# Patient Record
Sex: Female | Born: 1935
Health system: Southern US, Community
[De-identification: ages and names within clinical notes are randomized; demographics above are authoritative.]

## PROBLEM LIST (undated history)

## (undated) DIAGNOSIS — R32 Unspecified urinary incontinence: Secondary | ICD-10-CM

## (undated) DIAGNOSIS — B019 Varicella without complication: Secondary | ICD-10-CM

## (undated) DIAGNOSIS — C801 Malignant (primary) neoplasm, unspecified: Secondary | ICD-10-CM

## (undated) DIAGNOSIS — M1990 Unspecified osteoarthritis, unspecified site: Secondary | ICD-10-CM

## (undated) DIAGNOSIS — N63 Unspecified lump in unspecified breast: Secondary | ICD-10-CM

## (undated) DIAGNOSIS — C50919 Malignant neoplasm of unspecified site of unspecified female breast: Secondary | ICD-10-CM

## (undated) DIAGNOSIS — K219 Gastro-esophageal reflux disease without esophagitis: Secondary | ICD-10-CM

## (undated) DIAGNOSIS — Z923 Personal history of irradiation: Secondary | ICD-10-CM

## (undated) HISTORY — PX: EYE SURGERY: SHX253

## (undated) HISTORY — DX: Unspecified osteoarthritis, unspecified site: M19.90

## (undated) HISTORY — DX: Unspecified urinary incontinence: R32

## (undated) HISTORY — DX: Varicella without complication: B01.9

## (undated) HISTORY — PX: COLONOSCOPY: SHX174

## (undated) HISTORY — PX: TONSILLECTOMY: SUR1361

## (undated) HISTORY — DX: Malignant (primary) neoplasm, unspecified: C80.1

## (undated) HISTORY — DX: Gastro-esophageal reflux disease without esophagitis: K21.9

---
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
## 1966-02-27 HISTORY — PX: ABDOMINAL HYSTERECTOMY: SHX81

## 1971-02-28 HISTORY — PX: AUGMENTATION MAMMAPLASTY: SUR837

## 1997-09-29 ENCOUNTER — Ambulatory Visit (HOSPITAL_COMMUNITY): Admission: RE | Admit: 1997-09-29 | Discharge: 1997-09-29 | Payer: Self-pay | Admitting: Ophthalmology

## 1998-12-29 ENCOUNTER — Encounter: Admission: RE | Admit: 1998-12-29 | Discharge: 1998-12-29 | Payer: Self-pay | Admitting: Geriatric Medicine

## 1998-12-29 ENCOUNTER — Encounter: Payer: Self-pay | Admitting: Geriatric Medicine

## 2001-03-13 ENCOUNTER — Encounter: Payer: Self-pay | Admitting: Geriatric Medicine

## 2001-03-13 ENCOUNTER — Encounter: Admission: RE | Admit: 2001-03-13 | Discharge: 2001-03-13 | Payer: Self-pay | Admitting: Geriatric Medicine

## 2003-02-28 HISTORY — PX: BREAST LUMPECTOMY: SHX2

## 2004-06-23 LAB — HM DEXA SCAN

## 2008-02-28 DIAGNOSIS — C50919 Malignant neoplasm of unspecified site of unspecified female breast: Secondary | ICD-10-CM

## 2008-02-28 DIAGNOSIS — C801 Malignant (primary) neoplasm, unspecified: Secondary | ICD-10-CM

## 2008-02-28 HISTORY — PX: BREAST SURGERY: SHX581

## 2008-02-28 HISTORY — PX: BREAST LUMPECTOMY: SHX2

## 2008-02-28 HISTORY — DX: Malignant neoplasm of unspecified site of unspecified female breast: C50.919

## 2008-02-28 HISTORY — DX: Malignant (primary) neoplasm, unspecified: C80.1

## 2011-03-16 DIAGNOSIS — Z961 Presence of intraocular lens: Secondary | ICD-10-CM | POA: Diagnosis not present

## 2011-03-28 DIAGNOSIS — D0590 Unspecified type of carcinoma in situ of unspecified breast: Secondary | ICD-10-CM | POA: Diagnosis not present

## 2011-03-29 DIAGNOSIS — D0590 Unspecified type of carcinoma in situ of unspecified breast: Secondary | ICD-10-CM | POA: Diagnosis not present

## 2011-03-29 DIAGNOSIS — N6489 Other specified disorders of breast: Secondary | ICD-10-CM | POA: Diagnosis not present

## 2011-04-04 DIAGNOSIS — D0590 Unspecified type of carcinoma in situ of unspecified breast: Secondary | ICD-10-CM | POA: Diagnosis not present

## 2011-04-10 DIAGNOSIS — K219 Gastro-esophageal reflux disease without esophagitis: Secondary | ICD-10-CM | POA: Diagnosis not present

## 2011-04-10 DIAGNOSIS — Z803 Family history of malignant neoplasm of breast: Secondary | ICD-10-CM | POA: Diagnosis not present

## 2011-04-10 DIAGNOSIS — N6039 Fibrosclerosis of unspecified breast: Secondary | ICD-10-CM | POA: Diagnosis not present

## 2011-04-10 DIAGNOSIS — Z853 Personal history of malignant neoplasm of breast: Secondary | ICD-10-CM | POA: Diagnosis not present

## 2011-04-10 DIAGNOSIS — N61 Mastitis without abscess: Secondary | ICD-10-CM | POA: Diagnosis not present

## 2011-04-10 DIAGNOSIS — N63 Unspecified lump in unspecified breast: Secondary | ICD-10-CM | POA: Diagnosis not present

## 2011-05-11 DIAGNOSIS — Z1231 Encounter for screening mammogram for malignant neoplasm of breast: Secondary | ICD-10-CM | POA: Diagnosis not present

## 2011-05-17 DIAGNOSIS — J209 Acute bronchitis, unspecified: Secondary | ICD-10-CM | POA: Diagnosis not present

## 2011-06-06 DIAGNOSIS — N393 Stress incontinence (female) (male): Secondary | ICD-10-CM | POA: Diagnosis not present

## 2011-06-06 DIAGNOSIS — Z853 Personal history of malignant neoplasm of breast: Secondary | ICD-10-CM | POA: Diagnosis not present

## 2011-06-06 DIAGNOSIS — E785 Hyperlipidemia, unspecified: Secondary | ICD-10-CM | POA: Diagnosis not present

## 2011-06-06 DIAGNOSIS — Z0000 Encounter for general adult medical examination without abnormal findings: Secondary | ICD-10-CM | POA: Diagnosis not present

## 2011-06-06 DIAGNOSIS — K219 Gastro-esophageal reflux disease without esophagitis: Secondary | ICD-10-CM | POA: Diagnosis not present

## 2011-06-13 DIAGNOSIS — E785 Hyperlipidemia, unspecified: Secondary | ICD-10-CM | POA: Diagnosis not present

## 2011-06-13 DIAGNOSIS — Z79899 Other long term (current) drug therapy: Secondary | ICD-10-CM | POA: Diagnosis not present

## 2011-06-13 DIAGNOSIS — R5381 Other malaise: Secondary | ICD-10-CM | POA: Diagnosis not present

## 2011-06-22 DIAGNOSIS — Z961 Presence of intraocular lens: Secondary | ICD-10-CM | POA: Diagnosis not present

## 2011-06-22 DIAGNOSIS — H43399 Other vitreous opacities, unspecified eye: Secondary | ICD-10-CM | POA: Diagnosis not present

## 2011-06-28 DIAGNOSIS — G4700 Insomnia, unspecified: Secondary | ICD-10-CM | POA: Diagnosis not present

## 2011-06-28 DIAGNOSIS — K219 Gastro-esophageal reflux disease without esophagitis: Secondary | ICD-10-CM | POA: Diagnosis not present

## 2011-06-28 DIAGNOSIS — E785 Hyperlipidemia, unspecified: Secondary | ICD-10-CM | POA: Diagnosis not present

## 2011-06-28 DIAGNOSIS — N393 Stress incontinence (female) (male): Secondary | ICD-10-CM | POA: Diagnosis not present

## 2011-08-08 DIAGNOSIS — N63 Unspecified lump in unspecified breast: Secondary | ICD-10-CM | POA: Diagnosis not present

## 2011-08-15 DIAGNOSIS — N61 Mastitis without abscess: Secondary | ICD-10-CM | POA: Diagnosis not present

## 2011-09-12 DIAGNOSIS — D0590 Unspecified type of carcinoma in situ of unspecified breast: Secondary | ICD-10-CM | POA: Diagnosis not present

## 2011-12-03 DIAGNOSIS — Z23 Encounter for immunization: Secondary | ICD-10-CM | POA: Diagnosis not present

## 2011-12-04 DIAGNOSIS — J209 Acute bronchitis, unspecified: Secondary | ICD-10-CM | POA: Diagnosis not present

## 2012-01-05 DIAGNOSIS — N3946 Mixed incontinence: Secondary | ICD-10-CM | POA: Diagnosis not present

## 2012-01-05 DIAGNOSIS — K219 Gastro-esophageal reflux disease without esophagitis: Secondary | ICD-10-CM | POA: Diagnosis not present

## 2012-01-05 DIAGNOSIS — Z853 Personal history of malignant neoplasm of breast: Secondary | ICD-10-CM | POA: Diagnosis not present

## 2012-02-13 DIAGNOSIS — N61 Mastitis without abscess: Secondary | ICD-10-CM | POA: Diagnosis not present

## 2012-03-05 DIAGNOSIS — D0590 Unspecified type of carcinoma in situ of unspecified breast: Secondary | ICD-10-CM | POA: Diagnosis not present

## 2012-03-05 DIAGNOSIS — N6489 Other specified disorders of breast: Secondary | ICD-10-CM | POA: Diagnosis not present

## 2012-03-05 DIAGNOSIS — Z79899 Other long term (current) drug therapy: Secondary | ICD-10-CM | POA: Diagnosis not present

## 2012-03-05 DIAGNOSIS — Z1231 Encounter for screening mammogram for malignant neoplasm of breast: Secondary | ICD-10-CM | POA: Diagnosis not present

## 2012-03-07 DIAGNOSIS — D0590 Unspecified type of carcinoma in situ of unspecified breast: Secondary | ICD-10-CM | POA: Diagnosis not present

## 2012-03-07 DIAGNOSIS — Z1231 Encounter for screening mammogram for malignant neoplasm of breast: Secondary | ICD-10-CM | POA: Diagnosis not present

## 2012-03-07 DIAGNOSIS — Z79899 Other long term (current) drug therapy: Secondary | ICD-10-CM | POA: Diagnosis not present

## 2012-03-07 DIAGNOSIS — R928 Other abnormal and inconclusive findings on diagnostic imaging of breast: Secondary | ICD-10-CM | POA: Diagnosis not present

## 2012-03-07 DIAGNOSIS — N6489 Other specified disorders of breast: Secondary | ICD-10-CM | POA: Diagnosis not present

## 2012-03-13 DIAGNOSIS — N63 Unspecified lump in unspecified breast: Secondary | ICD-10-CM | POA: Diagnosis not present

## 2012-03-30 LAB — HM MAMMOGRAPHY: HM Mammogram: NORMAL

## 2012-04-24 DIAGNOSIS — Z79899 Other long term (current) drug therapy: Secondary | ICD-10-CM | POA: Diagnosis not present

## 2012-04-24 DIAGNOSIS — N6489 Other specified disorders of breast: Secondary | ICD-10-CM | POA: Diagnosis not present

## 2012-04-24 DIAGNOSIS — Z1231 Encounter for screening mammogram for malignant neoplasm of breast: Secondary | ICD-10-CM | POA: Diagnosis not present

## 2012-04-24 DIAGNOSIS — D0590 Unspecified type of carcinoma in situ of unspecified breast: Secondary | ICD-10-CM | POA: Diagnosis not present

## 2012-05-23 DIAGNOSIS — E785 Hyperlipidemia, unspecified: Secondary | ICD-10-CM | POA: Diagnosis not present

## 2012-05-23 DIAGNOSIS — Z79899 Other long term (current) drug therapy: Secondary | ICD-10-CM | POA: Diagnosis not present

## 2012-05-23 DIAGNOSIS — Z0000 Encounter for general adult medical examination without abnormal findings: Secondary | ICD-10-CM | POA: Diagnosis not present

## 2012-06-06 DIAGNOSIS — Z0000 Encounter for general adult medical examination without abnormal findings: Secondary | ICD-10-CM | POA: Diagnosis not present

## 2012-06-06 DIAGNOSIS — Z853 Personal history of malignant neoplasm of breast: Secondary | ICD-10-CM | POA: Diagnosis not present

## 2012-06-06 DIAGNOSIS — K219 Gastro-esophageal reflux disease without esophagitis: Secondary | ICD-10-CM | POA: Diagnosis not present

## 2012-06-06 DIAGNOSIS — M1710 Unilateral primary osteoarthritis, unspecified knee: Secondary | ICD-10-CM | POA: Diagnosis not present

## 2012-06-06 DIAGNOSIS — N3941 Urge incontinence: Secondary | ICD-10-CM | POA: Diagnosis not present

## 2012-08-13 DIAGNOSIS — N63 Unspecified lump in unspecified breast: Secondary | ICD-10-CM | POA: Diagnosis not present

## 2012-11-27 ENCOUNTER — Ambulatory Visit (INDEPENDENT_AMBULATORY_CARE_PROVIDER_SITE_OTHER): Payer: Medicare Other | Admitting: Internal Medicine

## 2012-11-27 ENCOUNTER — Encounter: Payer: Self-pay | Admitting: Internal Medicine

## 2012-11-27 VITALS — BP 138/74 | HR 97 | Temp 97.8°F | Resp 12 | Ht 62.75 in | Wt 191.8 lb

## 2012-11-27 DIAGNOSIS — M25559 Pain in unspecified hip: Secondary | ICD-10-CM | POA: Diagnosis not present

## 2012-11-27 DIAGNOSIS — K219 Gastro-esophageal reflux disease without esophagitis: Secondary | ICD-10-CM | POA: Diagnosis not present

## 2012-11-27 DIAGNOSIS — Z79899 Other long term (current) drug therapy: Secondary | ICD-10-CM

## 2012-11-27 DIAGNOSIS — M25562 Pain in left knee: Secondary | ICD-10-CM | POA: Insufficient documentation

## 2012-11-27 DIAGNOSIS — G8929 Other chronic pain: Secondary | ICD-10-CM

## 2012-11-27 DIAGNOSIS — R5381 Other malaise: Secondary | ICD-10-CM

## 2012-11-27 DIAGNOSIS — M25569 Pain in unspecified knee: Secondary | ICD-10-CM

## 2012-11-27 DIAGNOSIS — Z803 Family history of malignant neoplasm of breast: Secondary | ICD-10-CM | POA: Diagnosis not present

## 2012-11-27 DIAGNOSIS — Z1211 Encounter for screening for malignant neoplasm of colon: Secondary | ICD-10-CM

## 2012-11-27 DIAGNOSIS — Z23 Encounter for immunization: Secondary | ICD-10-CM | POA: Diagnosis not present

## 2012-11-27 DIAGNOSIS — D126 Benign neoplasm of colon, unspecified: Secondary | ICD-10-CM | POA: Insufficient documentation

## 2012-11-27 DIAGNOSIS — E785 Hyperlipidemia, unspecified: Secondary | ICD-10-CM

## 2012-11-27 DIAGNOSIS — Z853 Personal history of malignant neoplasm of breast: Secondary | ICD-10-CM | POA: Insufficient documentation

## 2012-11-27 DIAGNOSIS — IMO0002 Reserved for concepts with insufficient information to code with codable children: Secondary | ICD-10-CM | POA: Insufficient documentation

## 2012-11-27 DIAGNOSIS — M533 Sacrococcygeal disorders, not elsewhere classified: Secondary | ICD-10-CM

## 2012-11-27 DIAGNOSIS — E559 Vitamin D deficiency, unspecified: Secondary | ICD-10-CM

## 2012-11-27 MED ORDER — TETANUS-DIPHTH-ACELL PERTUSSIS 5-2.5-18.5 LF-MCG/0.5 IM SUSP: 0.5000 mL | Freq: Once | INTRAMUSCULAR | Status: DC

## 2012-11-27 NOTE — Assessment & Plan Note (Signed)
She has crepitus on exam, no effusion. Plain film ordered to evaluated for DJD

## 2012-11-27 NOTE — Assessment & Plan Note (Addendum)
She needs referral to oncology for follow up.  Referring to  [facility] Dr Orlie Dakin

## 2012-11-27 NOTE — Progress Notes (Signed)
Patient ID: Brianna Perry, female   DOB: 14-Dec-1932, 79 y.o.   MRN: [id number]   Patient Active Problem List   Diagnosis Date Noted  . Family history of breast cancer in female 11/27/2012  . Screening for colon cancer 11/27/2012  . GERD (gastroesophageal reflux disease) 11/27/2012  . Chronic knee pain 11/27/2012  . Sacroiliac joint disease 11/27/2012    Subjective:  CC:   Chief Complaint  Patient presents with  . Establish Care    HPI:   Brianna Perry is a 79 y.o. female who presents as a new patient to establish primary care with the chief complaint of  Left knee and left posterior hip/sacro iliac pain, for the past 6 to 8 months   No prior trauma.   No previous steroid use. Aggravated by several weeks of packing up her house prior to a recent move. also aggravated by prolonged sitting, and long walks which aggravate knee first.  She can no longer sleep on either side because it aggravated her knee pain.  She has tried placing a pillow between the knees which does not help.    Use of aleve helps somewhat during the day but not at night .  Has a tempur pedic mattress.  Limping.  Prior trials of prescription NSAIDs,  Feels like Aleve helps more.  The knee hurst with walking and kneeling   Worked in a greenhouse for 20 years,  Attributes her joint pain to years of physical labor.     .     Past Medical History  Diagnosis Date  . Arthritis   . Cancer 2010    Right breast  . Chickenpox   . GERD (gastroesophageal reflux disease)   . Urine incontinence     Past Surgical History  Procedure Laterality Date  . Breast surgery Right 2010  . Abdominal hysterectomy  1968  . Tonsillectomy  1941    Family History  Problem Relation Age of Onset  . Arthritis Mother   . Heart disease Mother   . Hypertension Mother   . Heart disease Father     History   Social History  . Marital Status: Married    Spouse Name: Brianna Perry    Number of Children: Brianna Perry  . Years of Education: Brianna Perry    Occupational History  . Not on file.   Social History Main Topics  . Smoking status: Never Smoker   . Smokeless tobacco: Never Used  . Alcohol Use: No  . Drug Use: No  . Sexual Activity: Yes   Other Topics Concern  . Not on file   Social History Narrative  . No narrative on file       @ALLHX @    Review of Systems:   The remainder of the review of systems was negative except those addressed in the HPI.       Objective:  BP 138/74  Pulse 97  Temp(Src) 97.8 F (36.6 C) (Oral)  Resp 12  Ht 5' 2.75" (1.594 m)  Wt 191 lb 12 oz (86.977 kg)  BMI 34.23 kg/m2  SpO2 97%  General appearance: alert, cooperative and appears stated age Ears: normal TM's and external ear canals both ears Throat: lips, mucosa, and tongue normal; teeth and gums normal Neck: no adenopathy, no carotid bruit, supple, symmetrical, trachea midline and thyroid not enlarged, symmetric, no tenderness/mass/nodules Back: symmetric, no curvature. ROM normal. No CVA tenderness. Lungs: clear to auscultation bilaterally Heart: regular rate and rhythm, S1, S2 normal, no murmur, click,  rub or gallop Abdomen: soft, non-tender; bowel sounds normal; no masses,  no organomegaly Pulses: 2+ and symmetric Skin: Skin color, texture, turgor normal. No rashes or lesions Lymph nodes: Cervical, supraclavicular, and axillary nodes normal. MSK: negative straight leg lift  Assessment and Plan:  Family history of breast cancer in female She needs referral to oncology for follow up.  Referring to  [facility] Dr Orlie Dakin   Chronic knee pain She has crepitus on exam, no effusion. Plain film ordered to evaluated for DJD  Sacroiliac joint disease Suspected by exam and point tenderness . Plain films ordered    Updated Medication List Outpatient Encounter Prescriptions as of 11/27/2012  Medication Sig Dispense Refill  . aspirin 81 MG tablet Take 81 mg by mouth daily.      . calcium carbonate (OS-CAL) 600 MG TABS tablet  Take 1,200 mg by mouth daily.      . Multiple Vitamin (MULTIVITAMIN) capsule Take 1 capsule by mouth daily.      . Omega-3 Fatty Acids (FISH OIL) 300 MG CAPS Take 1 capsule by mouth daily.      [name] omeprazole (PRILOSEC) 20 MG capsule Take 1 capsule by mouth daily.      . tamoxifen (NOLVADEX) 20 MG tablet Take 1 tablet by mouth daily.      [name] torsemide (DEMADEX) 20 MG tablet Take 20 mg by mouth as needed.      . triazolam (HALCION) 0.25 MG tablet Take 0.25 mg by mouth at bedtime as needed.      . TDaP (BOOSTRIX) 5-2.5-18.5 LF-MCG/0.5 injection Inject 0.5 mLs into the muscle once.  0.5 mL  0   No facility-administered encounter medications on file as of 11/27/2012.     Orders Placed This Encounter  Procedures  . HM MAMMOGRAPHY  . DG Knee Complete 4 Views Left  . DG Hip Complete Left  . DG Lumbar Spine Complete  . Flu Vaccine QUAD 36+ mos PF IM (Fluarix)  . CBC with Differential  . Comprehensive metabolic panel  . TSH  . Lipid panel  . Vit D  25 hydroxy (rtn osteoporosis monitoring)  . Ambulatory referral to Oncology    No Follow-up on file.

## 2012-11-27 NOTE — Patient Instructions (Addendum)
Referral to Oncology in process  Increase your Aleve to twice daily.  You can add 500 mg tylenol up to 4 daily as needed  X rays of lumbar spine hip and knee   Return for fasting labs at your convenience  You need your tetanus-diptheria-pertussis vaccine (TDaP) but you can get it for less $$$ at a local pharmacy with the script I have provided you.

## 2012-11-27 NOTE — Assessment & Plan Note (Signed)
Suspected by exam and point tenderness . Plain films ordered

## 2012-12-02 ENCOUNTER — Encounter: Payer: Self-pay | Admitting: Emergency Medicine

## 2012-12-06 ENCOUNTER — Ambulatory Visit: Payer: Self-pay | Admitting: Internal Medicine

## 2012-12-06 DIAGNOSIS — M25569 Pain in unspecified knee: Secondary | ICD-10-CM | POA: Diagnosis not present

## 2012-12-06 DIAGNOSIS — M5137 Other intervertebral disc degeneration, lumbosacral region: Secondary | ICD-10-CM | POA: Diagnosis not present

## 2012-12-08 ENCOUNTER — Telehealth: Payer: Self-pay | Admitting: Internal Medicine

## 2012-12-08 NOTE — Telephone Encounter (Signed)
X rays of left knee showed no acute abnormality.  The lumbar  Spine films showed moderately severe degenerative changes at multiple levels.

## 2012-12-09 NOTE — Telephone Encounter (Signed)
Patient wondering about xray results. Please advise.

## 2012-12-10 NOTE — Telephone Encounter (Signed)
Called and advised patient of results

## 2012-12-23 ENCOUNTER — Ambulatory Visit: Payer: Self-pay | Admitting: Oncology

## 2012-12-23 DIAGNOSIS — Z79810 Long term (current) use of selective estrogen receptor modulators (SERMs): Secondary | ICD-10-CM | POA: Diagnosis not present

## 2012-12-23 DIAGNOSIS — Z9889 Other specified postprocedural states: Secondary | ICD-10-CM | POA: Diagnosis not present

## 2012-12-23 DIAGNOSIS — K219 Gastro-esophageal reflux disease without esophagitis: Secondary | ICD-10-CM | POA: Diagnosis not present

## 2012-12-23 DIAGNOSIS — Z923 Personal history of irradiation: Secondary | ICD-10-CM | POA: Diagnosis not present

## 2012-12-23 DIAGNOSIS — Z170 Estrogen receptor positive status [ER+]: Secondary | ICD-10-CM | POA: Diagnosis not present

## 2012-12-23 DIAGNOSIS — Z7982 Long term (current) use of aspirin: Secondary | ICD-10-CM | POA: Diagnosis not present

## 2012-12-23 DIAGNOSIS — Z79899 Other long term (current) drug therapy: Secondary | ICD-10-CM | POA: Diagnosis not present

## 2012-12-23 DIAGNOSIS — Z90710 Acquired absence of both cervix and uterus: Secondary | ICD-10-CM | POA: Diagnosis not present

## 2012-12-23 DIAGNOSIS — D0590 Unspecified type of carcinoma in situ of unspecified breast: Secondary | ICD-10-CM | POA: Diagnosis not present

## 2012-12-24 ENCOUNTER — Encounter: Payer: Self-pay | Admitting: Internal Medicine

## 2012-12-28 ENCOUNTER — Ambulatory Visit: Payer: Self-pay | Admitting: Oncology

## 2013-01-29 ENCOUNTER — Telehealth: Payer: Self-pay | Admitting: Internal Medicine

## 2013-01-29 NOTE — Telephone Encounter (Signed)
Pt's husband dropped of forms for medical clearance for exercise at [facility]. I put it in Dr. Melina Schools inbox up front. They ask if we could fax it back over to [facility] (Fax number is on form)

## 2013-02-04 NOTE — Telephone Encounter (Signed)
Forms faxed

## 2013-02-27 HISTORY — PX: BREAST CYST ASPIRATION: SHX578

## 2013-03-13 ENCOUNTER — Ambulatory Visit: Payer: Self-pay | Admitting: Oncology

## 2013-03-13 DIAGNOSIS — Z853 Personal history of malignant neoplasm of breast: Secondary | ICD-10-CM | POA: Diagnosis not present

## 2013-03-13 DIAGNOSIS — R922 Inconclusive mammogram: Secondary | ICD-10-CM | POA: Diagnosis not present

## 2013-03-13 DIAGNOSIS — N63 Unspecified lump in unspecified breast: Secondary | ICD-10-CM | POA: Diagnosis not present

## 2013-03-17 ENCOUNTER — Ambulatory Visit: Payer: Self-pay | Admitting: Oncology

## 2013-03-17 DIAGNOSIS — N63 Unspecified lump in unspecified breast: Secondary | ICD-10-CM | POA: Diagnosis not present

## 2013-03-17 DIAGNOSIS — N6009 Solitary cyst of unspecified breast: Secondary | ICD-10-CM | POA: Diagnosis not present

## 2013-03-24 LAB — HM MAMMOGRAPHY: HM Mammogram: NORMAL

## 2013-05-16 ENCOUNTER — Other Ambulatory Visit: Payer: Self-pay | Admitting: Internal Medicine

## 2013-06-18 ENCOUNTER — Ambulatory Visit (INDEPENDENT_AMBULATORY_CARE_PROVIDER_SITE_OTHER): Payer: Medicare Other | Admitting: Internal Medicine

## 2013-06-18 ENCOUNTER — Encounter: Payer: Self-pay | Admitting: Internal Medicine

## 2013-06-18 VITALS — BP 156/76 | HR 99 | Temp 98.1°F | Resp 16 | Wt 196.8 lb

## 2013-06-18 DIAGNOSIS — R5381 Other malaise: Secondary | ICD-10-CM | POA: Diagnosis not present

## 2013-06-18 DIAGNOSIS — E559 Vitamin D deficiency, unspecified: Secondary | ICD-10-CM

## 2013-06-18 DIAGNOSIS — R5383 Other fatigue: Secondary | ICD-10-CM | POA: Diagnosis not present

## 2013-06-18 DIAGNOSIS — M533 Sacrococcygeal disorders, not elsewhere classified: Secondary | ICD-10-CM

## 2013-06-18 DIAGNOSIS — E669 Obesity, unspecified: Secondary | ICD-10-CM | POA: Diagnosis not present

## 2013-06-18 MED ORDER — TRIAZOLAM 0.25 MG PO TABS: 0.2500 mg | ORAL_TABLET | Freq: Every evening | ORAL | Status: DC | PRN

## 2013-06-18 MED ORDER — METHOCARBAMOL 500 MG PO TABS: 500.0000 mg | ORAL_TABLET | Freq: Three times a day (TID) | ORAL | Status: DC | PRN

## 2013-06-18 MED ORDER — TRAMADOL HCL 50 MG PO TABS: 50.0000 mg | ORAL_TABLET | Freq: Three times a day (TID) | ORAL | Status: DC | PRN

## 2013-06-18 NOTE — Patient Instructions (Signed)
I am prescribing a muscle relaxer,  methocarbamol to help with the muscle spasms , along with tramadol,  A pain reliever  The muscle relaxer may make you drowsy so do not combine with halcion.  Alternate applying heating pad and ice pack  If no improvement in 2 weeks,  Call for PT referral

## 2013-06-18 NOTE — Progress Notes (Signed)
Patient ID: Brianna Perry, female   DOB: May 12, 1932, 80 y.o.   MRN: [id number]  Patient Active Problem List   Diagnosis Date Noted  . Family history of breast cancer in female 11/27/2012  . Screening for colon cancer 11/27/2012  . GERD (gastroesophageal reflux disease) 11/27/2012  . Chronic knee pain 11/27/2012  . Degenerative disk disease 11/27/2012    Subjective:  CC:   Chief Complaint  Patient presents with  . 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  Using aleve with no appreciable difference .  Pain is keeping her awake at night. Has tried multiple NSAIDs over the years,  None work as well as Curator.     Past Medical History  Diagnosis Date  . Arthritis   . Cancer 2010    Right breast  . Chickenpox   . GERD (gastroesophageal reflux disease)   . Urine incontinence     Past Surgical History  Procedure Laterality Date  . Breast surgery Right 2010  . Abdominal hysterectomy  1968  . Tonsillectomy  1941       The following portions of the patient's history were reviewed and updated as appropriate: Allergies, current medications, and problem list.    Review of Systems:   Patient denies headache, fevers, malaise, unintentional weight loss, skin rash, eye pain, sinus congestion and sinus pain, sore throat, dysphagia,  hemoptysis , cough, dyspnea, wheezing, chest pain, palpitations, orthopnea, edema, abdominal pain, nausea, melena, diarrhea, constipation, flank pain, dysuria, hematuria, urinary  Frequency, nocturia, numbness, tingling, seizures,  Focal weakness, Loss of consciousness,  Tremor, insomnia, depression, anxiety, and suicidal ideation.     History   Social History  . Marital Status: Married    Spouse Name: N/A   Number of Children: N/A  . Years of Education: N/A   Occupational History  . Not on file.   Social History Main Topics  . Smoking status: Never Smoker   . Smokeless tobacco: Never Used  . Alcohol Use: No  . Drug Use: No  . Sexual Activity: Yes   Other Topics Concern  . Not on file   Social History Narrative  . No narrative on file    Objective:  Filed Vitals:   06/18/13 1510  BP: 156/76  Pulse: 99  Temp: 98.1 F (36.7 C)  Resp: 16     General appearance: alert, cooperative and appears stated age Ears: normal TM's and external ear canals both ears Throat: lips, mucosa, and tongue normal; teeth and gums normal Neck: no adenopathy, no carotid bruit, supple, symmetrical, trachea midline and thyroid not enlarged, symmetric, no tenderness/mass/nodules Back: symmetric, no curvature. paraspinus muscles tender and in spasm on right side.  Negative straight leg lift. No vertebral tenderness.  No CVA tenderness. Lungs: clear to auscultation bilaterally Heart: regular rate and rhythm, S1, S2 normal, no murmur, click, rub or gallop Abdomen: soft, non-tender; bowel sounds normal; no masses,  no organomegaly Pulses: 2+ and symmetric Skin: Skin color, texture, turgor normal. No rashes or lesions Lymph nodes: Cervical, supraclavicular, and axillary nodes normal.  Assessment and Plan:  Degenerative disk disease Multilevel, by lumbar films done last October.  Current pain appears  To be due to muscle strain and  spasm .  Tramadol, aleve, muscle relaxer.   if no improvement.    Updated Medication List Outpatient Encounter Prescriptions as of 06/18/2013  Medication Sig  . aspirin 81 MG tablet Take 81 mg by mouth daily.  . calcium carbonate (OS-CAL) 600 MG TABS tablet Take 1,200 mg by mouth daily.  . Multiple Vitamin (MULTIVITAMIN) capsule Take 1 capsule by mouth daily.  . Omega-3 Fatty Acids (FISH OIL) 300 MG CAPS Take 1 capsule by mouth daily.  [name] omeprazole (PRILOSEC) 20 MG capsule  TAKE 1 CAPSULE BY MOUTIN THE MORNING  . tamoxifen (NOLVADEX) 20 MG tablet Take 1 tablet by mouth daily.  . TDaP (BOOSTRIX) 5-2.5-18.5 LF-MCG/0.5 injection Inject 0.5 mLs into the muscle once.  . torsemide (DEMADEX) 20 MG tablet Take 20 mg by mouth as needed.  . triazolam (HALCION) 0.25 MG tablet Take 1 tablet (0.25 mg total) by mouth at bedtime as needed.  . [DISCONTINUED] triazolam (HALCION) 0.25 MG tablet Take 0.25 mg by mouth at bedtime as needed.  . methocarbamol (ROBAXIN) 500 MG tablet Take 1 tablet (500 mg total) by mouth every 8 (eight) hours as needed for muscle spasms.  . traMADol (ULTRAM) 50 MG tablet Take 1 tablet (50 mg total) by mouth every 8 (eight) hours as needed.     Orders Placed This Encounter  Procedures  . TSH  . CBC with Differential  . Comprehensive metabolic panel  . LDL cholesterol, direct  . Vit D  25 hydroxy (rtn osteoporosis monitoring)    No Follow-up on file.

## 2013-06-18 NOTE — Progress Notes (Signed)
Pre-visit discussion using our clinic review tool. No additional management support is needed unless otherwise documented below in the visit note.  

## 2013-06-18 NOTE — Assessment & Plan Note (Addendum)
Multilevel, by lumbar films done last October.  Current pain appears  To be due to muscle strain and spasm .  Tramadol, aleve, muscle relaxer.   if no improvement.

## 2013-06-19 LAB — CBC WITH DIFFERENTIAL/PLATELET
BASOS PCT: 0.3 % (ref 0.0–3.0)
Basophils Absolute: 0 10*3/uL (ref 0.0–0.1)
EOS PCT: 1.7 % (ref 0.0–5.0)
Eosinophils Absolute: 0.1 10*3/uL (ref 0.0–0.7)
HCT: 35.5 % — ABNORMAL LOW (ref 36.0–46.0)
Hemoglobin: 11.8 g/dL — ABNORMAL LOW (ref 12.0–15.0)
LYMPHS PCT: 27.9 % (ref 12.0–46.0)
Lymphs Abs: 2 10*3/uL (ref 0.7–4.0)
MCHC: 33.4 g/dL (ref 30.0–36.0)
MCV: 87.5 fl (ref 78.0–100.0)
Monocytes Absolute: 0.7 10*3/uL (ref 0.1–1.0)
Monocytes Relative: 8.9 % (ref 3.0–12.0)
NEUTROS PCT: 61.2 % (ref 43.0–77.0)
Neutro Abs: 4.5 10*3/uL (ref 1.4–7.7)
Platelets: 244 10*3/uL (ref 150.0–400.0)
RBC: 4.05 Mil/uL (ref 3.87–5.11)
RDW: 14.6 % (ref 11.5–14.6)
WBC: 7.3 10*3/uL (ref 4.5–10.5)

## 2013-06-19 LAB — COMPREHENSIVE METABOLIC PANEL
ALK PHOS: 54 U/L (ref 39–117)
ALT: 23 U/L (ref 0–35)
AST: 27 U/L (ref 0–37)
Albumin: 3.4 g/dL — ABNORMAL LOW (ref 3.5–5.2)
BILIRUBIN TOTAL: 0.3 mg/dL (ref 0.3–1.2)
BUN: 17 mg/dL (ref 6–23)
CO2: 26 mEq/L (ref 19–32)
Calcium: 9.2 mg/dL (ref 8.4–10.5)
Chloride: 106 mEq/L (ref 96–112)
Creatinine, Ser: 0.6 mg/dL (ref 0.4–1.2)
GFR: 96.9 mL/min (ref >60.00)
GLUCOSE: 147 mg/dL — AB (ref 70–99)
Potassium: 4 mEq/L (ref 3.5–5.1)
Sodium: 140 mEq/L (ref 135–145)
TOTAL PROTEIN: 6.6 g/dL (ref 6.0–8.3)

## 2013-06-19 LAB — TSH: TSH: 2.67 u[IU]/mL (ref 0.35–5.50)

## 2013-06-19 LAB — VITAMIN D 25 HYDROXY (VIT D DEFICIENCY, FRACTURES): Vit D, 25-Hydroxy: 48 ng/mL (ref 30–89)

## 2013-06-19 LAB — LDL CHOLESTEROL, DIRECT: LDL DIRECT: 86.9 mg/dL

## 2013-06-21 ENCOUNTER — Encounter: Payer: Self-pay | Admitting: Internal Medicine

## 2013-06-23 ENCOUNTER — Ambulatory Visit: Payer: Self-pay | Admitting: Oncology

## 2013-06-23 DIAGNOSIS — Z79899 Other long term (current) drug therapy: Secondary | ICD-10-CM | POA: Diagnosis not present

## 2013-06-23 DIAGNOSIS — Z79810 Long term (current) use of selective estrogen receptor modulators (SERMs): Secondary | ICD-10-CM | POA: Diagnosis not present

## 2013-06-23 DIAGNOSIS — D0590 Unspecified type of carcinoma in situ of unspecified breast: Secondary | ICD-10-CM | POA: Diagnosis not present

## 2013-06-23 DIAGNOSIS — Z170 Estrogen receptor positive status [ER+]: Secondary | ICD-10-CM | POA: Diagnosis not present

## 2013-06-27 ENCOUNTER — Ambulatory Visit: Payer: Self-pay | Admitting: Oncology

## 2013-08-14 ENCOUNTER — Other Ambulatory Visit: Payer: Self-pay | Admitting: Internal Medicine

## 2013-08-14 NOTE — Telephone Encounter (Signed)
Last OV and refill 4.22.15.  Please advise refill

## 2013-08-14 NOTE — Telephone Encounter (Signed)
Ok to refill,  Authorized in epic and sent  

## 2013-08-21 ENCOUNTER — Other Ambulatory Visit: Payer: Self-pay | Admitting: Internal Medicine

## 2013-09-09 ENCOUNTER — Encounter: Payer: Self-pay | Admitting: Internal Medicine

## 2013-11-05 ENCOUNTER — Encounter: Payer: Self-pay | Admitting: Internal Medicine

## 2013-11-08 ENCOUNTER — Encounter: Payer: Self-pay | Admitting: Internal Medicine

## 2013-11-23 ENCOUNTER — Other Ambulatory Visit: Payer: Self-pay | Admitting: Internal Medicine

## 2013-11-28 DIAGNOSIS — Z23 Encounter for immunization: Secondary | ICD-10-CM | POA: Diagnosis not present

## 2013-12-12 ENCOUNTER — Other Ambulatory Visit: Payer: Self-pay

## 2013-12-22 ENCOUNTER — Encounter: Payer: Self-pay | Admitting: Internal Medicine

## 2013-12-22 ENCOUNTER — Ambulatory Visit (INDEPENDENT_AMBULATORY_CARE_PROVIDER_SITE_OTHER): Payer: Medicare Other | Admitting: Internal Medicine

## 2013-12-22 VITALS — BP 140/80 | HR 71 | Temp 97.8°F | Resp 16 | Ht 63.0 in | Wt 195.8 lb

## 2013-12-22 DIAGNOSIS — Z23 Encounter for immunization: Secondary | ICD-10-CM | POA: Diagnosis not present

## 2013-12-22 DIAGNOSIS — M545 Low back pain, unspecified: Secondary | ICD-10-CM

## 2013-12-22 DIAGNOSIS — Z853 Personal history of malignant neoplasm of breast: Secondary | ICD-10-CM

## 2013-12-22 DIAGNOSIS — Z79899 Other long term (current) drug therapy: Secondary | ICD-10-CM

## 2013-12-22 DIAGNOSIS — R609 Edema, unspecified: Secondary | ICD-10-CM

## 2013-12-22 DIAGNOSIS — E785 Hyperlipidemia, unspecified: Secondary | ICD-10-CM | POA: Diagnosis not present

## 2013-12-22 DIAGNOSIS — G4700 Insomnia, unspecified: Secondary | ICD-10-CM | POA: Diagnosis not present

## 2013-12-22 DIAGNOSIS — R351 Nocturia: Secondary | ICD-10-CM | POA: Diagnosis not present

## 2013-12-22 DIAGNOSIS — E669 Obesity, unspecified: Secondary | ICD-10-CM

## 2013-12-22 DIAGNOSIS — Z0000 Encounter for general adult medical examination without abnormal findings: Secondary | ICD-10-CM

## 2013-12-22 DIAGNOSIS — M79605 Pain in left leg: Secondary | ICD-10-CM

## 2013-12-22 DIAGNOSIS — M25562 Pain in left knee: Secondary | ICD-10-CM

## 2013-12-22 DIAGNOSIS — Z13820 Encounter for screening for osteoporosis: Secondary | ICD-10-CM | POA: Diagnosis not present

## 2013-12-22 DIAGNOSIS — G8929 Other chronic pain: Secondary | ICD-10-CM

## 2013-12-22 DIAGNOSIS — M5442 Lumbago with sciatica, left side: Secondary | ICD-10-CM | POA: Diagnosis not present

## 2013-12-22 DIAGNOSIS — F5104 Psychophysiologic insomnia: Secondary | ICD-10-CM | POA: Insufficient documentation

## 2013-12-22 LAB — POCT URINALYSIS DIPSTICK
Bilirubin, UA: NEGATIVE
Glucose, UA: NEGATIVE
KETONES UA: NEGATIVE
Leukocytes, UA: NEGATIVE
Nitrite, UA: NEGATIVE
Protein, UA: NEGATIVE
RBC UA: NEGATIVE
Spec Grav, UA: 1.015
Urobilinogen, UA: 0.2
pH, UA: 7

## 2013-12-22 LAB — COMPREHENSIVE METABOLIC PANEL
ALBUMIN: 3.2 g/dL — AB (ref 3.5–5.2)
ALK PHOS: 69 U/L (ref 39–117)
ALT: 26 U/L (ref 0–35)
AST: 24 U/L (ref 0–37)
BILIRUBIN TOTAL: 0.4 mg/dL (ref 0.2–1.2)
BUN: 18 mg/dL (ref 6–23)
CO2: 26 mEq/L (ref 19–32)
Calcium: 8.9 mg/dL (ref 8.4–10.5)
Chloride: 104 mEq/L (ref 96–112)
Creatinine, Ser: 0.7 mg/dL (ref 0.4–1.2)
GFR: 84.3 mL/min (ref >60.00)
Glucose, Bld: 90 mg/dL (ref 70–99)
POTASSIUM: 4 meq/L (ref 3.5–5.1)
SODIUM: 139 meq/L (ref 135–145)
TOTAL PROTEIN: 7.2 g/dL (ref 6.0–8.3)

## 2013-12-22 LAB — LIPID PANEL
CHOLESTEROL: 216 mg/dL — AB (ref 0–200)
HDL: 80 mg/dL (ref >39.00)
LDL Cholesterol: 119 mg/dL — ABNORMAL HIGH (ref 0–99)
NONHDL: 136
Total CHOL/HDL Ratio: 3
Triglycerides: 85 mg/dL (ref 0.0–149.0)
VLDL: 17 mg/dL (ref 0.0–40.0)

## 2013-12-22 LAB — URINALYSIS, ROUTINE W REFLEX MICROSCOPIC
Bilirubin Urine: NEGATIVE
Hgb urine dipstick: NEGATIVE
Ketones, ur: NEGATIVE
Leukocytes, UA: NEGATIVE
NITRITE: NEGATIVE
PH: 7 (ref 5.0–8.0)
RBC / HPF: NONE SEEN (ref 0–?)
Specific Gravity, Urine: 1.02 (ref 1.000–1.030)
Total Protein, Urine: NEGATIVE
Urine Glucose: NEGATIVE
Urobilinogen, UA: 0.2 (ref 0.0–1.0)

## 2013-12-22 MED ORDER — TRIAZOLAM 0.25 MG PO TABS: 0.2500 mg | ORAL_TABLET | Freq: Every evening | ORAL | Status: DC | PRN

## 2013-12-22 NOTE — Progress Notes (Signed)
Pre-visit discussion using our clinic review tool. No additional management support is needed unless otherwise documented below in the visit note.  

## 2013-12-22 NOTE — Patient Instructions (Signed)
You had your annual  wellness exam today.  We will repeat your MRI and your bone density test soon   You received the Prevnar pneumonia   Vaccine today.     We will contact you with the blood work results.  I will prescribe a medication for  overactive bladder once I rule out infection   Your leg swelling is likely due to venous insufficiency,  Which can be managed withdaily wearing of   compression knee highs.  I have given you an rx for theses,  [employer] does a great job of measuring you and fitting you for these    I recommend getting the majority of your calcium and Vitamin D  through diet rather than supplements given the recent association of calcium supplements with increased coronary artery calcium scores (You need 1200 mg daily )   Unsweetened almond/coconut milk is a great low calorie low carb, cholesterol free  way to increase your dietary calcium and vitamin D.  Try the blue Diamond  Brand (0nly 30 cal 1 carb and 433 mg calcium per 8 ounce serving)    .This is  One version of a  "Low GI"  Diet:  It will still lower your blood sugars and allow you to lose 4 to 8  lbs  per month if you follow it carefully.  Your goal with exercise is a minimum of 30 minutes of aerobic exercise 5 days per week (Walking does not count once it becomes easy!)    All of the foods can be found at grocery stores and in bulk at [employer].  The Atkins protein bars and shakes are available in more varieties at [employer], [employer] and [place].     7 AM Breakfast:  Choose from the following:  Low carbohydrate Protein  Shakes (I recommend the [employer]AS AdvantEdge "Carb Control" shakes  Or the low carb shakes by Atkins.    2.5 carbs   Arnold's "Sandwhich Thin"toasted  w/ peanut butter (no jelly: about 20 net carbs  "Bagel Thin" with cream cheese and salmon: about 20 carbs   a scrambled egg/bacon/cheese burrito made with Mission's "carb balance" whole wheat tortilla  (about 10 net carbs )   Avoid cereal  and bananas, oatmeal and cream of wheat and grits. They are loaded with carbohydrates!   10 AM: high protein snack  Protein bar by Atkins (the snack size, under 200 cal, usually < 6 net carbs).    A stick of cheese:  Around 1 carb,  100 cal     Dannon Light n Fit [place] Yogurt  (80 cal, 8 carbs)  Other so called "protein bars" and Greek yogurts tend to be loaded with carbohydrates.  Remember, in food advertising, the word "energy" is synonymous for " carbohydrate."  Lunch:   A Sandwich using the bread choices listed, Can use any  Eggs,  lunchmeat, grilled meat or canned tuna), avocado, regular mayo/mustard  and cheese.  A Salad using blue cheese, ranch,  Goddess or vinagrette,  No croutons or "confetti" and no "candied nuts" but regular nuts OK.   No pretzels or chips.  Pickles and miniature sweet peppers are a good low carb alternative that provide a "crunch"  The bread is the only source of carbohydrate in a sandwich and  can be decreased by trying some of these alternatives to traditional loaf bread  Joseph's makes a pita bread and a flat bread that are 50 cal and 4 net carbs  available at [place] and [place].  This can be toasted to use with hummous as well  Toufayan makes a low carb flatbread that's 100 cal and 9 net carbs available at [employer] and [employer] makes 2 sizes of  Low carb whole wheat tortilla  (The large one is 210 cal and 6 net carbs) Avoid "Low fat dressings, as well as Barry Brunner and [place] dressings They are loaded with sugar!   3 PM/ Mid day  Snack:  Consider  1 ounce of  almonds, walnuts, pistachios, pecans, peanuts,  Macadamia nuts or a nut medley.  Avoid "granola"; the dried cranberries and raisins are loaded with carbohydrates. Mixed nuts as long as there are no raisins,  cranberries or dried fruit.    Try the prosciutto/mozzarella cheese sticks by Fiorruci  In deli /backery section   High protein      6 PM  Dinner:     Meat/fowl/fish with a green salad,  and either broccoli, cauliflower, green beans, spinach, brussel sprouts or  Lima beans. DO NOT BREAD THE PROTEIN!!      There is a low carb pasta by Dreamfield's that is acceptable and tastes great: only 5 digestible carbs/serving.( All grocery stores but [employer]Js carry it )  Try Hurley Cisco Angelo's chicken piccata or chicken or eggplant parm over low carb pasta.(Lowes and BJs)   Marjory Lies Sanchez's "Carnitas" (pulled pork, no sauce,  0 carbs) or his beef pot roast to make a dinner burrito (at [employer]J's)  Pesto over low carb pasta (bj's sells a good quality pesto in the center refrigerated section of the deli   Try satueeing  Cheral Marker with mushroooms  Whole wheat pasta is still full of digestible carbs and  Not as low in glycemic index as Dreamfield's.   Brown rice is still rice,  So skip the rice and noodles if you eat [place] or [place] (or at least limit to 1/2 cup)  9 PM snack :   Breyer's "low carb" fudgsicle or  ice cream bar (Carb Smart line), or  Weight Watcher's ice cream bar , or another "no sugar added" ice cream;  a serving of fresh berries/cherries with whipped cream   Cheese or DANNON'S LlGHT N FIT GREEK YOGURT  8 ounces of Blue Diamond unsweetened almond/cococunut milk    Avoid bananas, pineapple, grapes  and watermelon on a regular basis because they are high in sugar.  THINK OF THEM AS DESSERT  Remember that snack Substitutions should be less than 10 NET carbs per serving and meals < 20 carbs. Remember to subtract fiber grams to get the "net carbs."  Health Maintenance Adopting a healthy lifestyle and getting preventive care can go a long way to promote health and wellness. Talk with your health care provider about what schedule of regular examinations is right for you. This is a good chance for you to check in with your provider about disease prevention and staying healthy. In between checkups, there are plenty of things you can do on your own. Experts have done a lot of research about which  lifestyle changes and preventive measures are most likely to keep you healthy. Ask your health care provider for more information. WEIGHT AND DIET  Eat a healthy diet  Be sure to include plenty of vegetables, fruits, low-fat dairy products, and lean protein.  Do not eat a lot of foods high in solid fats, added sugars, or salt.  Get regular exercise. This is one of the most important  things you can do for your health.  Most adults should exercise for at least 150 minutes each week. The exercise should increase your heart rate and make you sweat (moderate-intensity exercise).  Most adults should also do strengthening exercises at least twice a week. This is in addition to the moderate-intensity exercise.  Maintain a healthy weight  Body mass index (BMI) is a measurement that can be used to identify possible weight problems. It estimates body fat based on height and weight. Your health care provider can help determine your BMI and help you achieve or maintain a healthy weight.  For females 61 years of age and older:   A BMI below 18.5 is considered underweight.  A BMI of 18.5 to 24.9 is normal.  A BMI of 25 to 29.9 is considered overweight.  A BMI of 30 and above is considered obese.  Watch levels of cholesterol and blood lipids  You should start having your blood tested for lipids and cholesterol at 78 years of age, then have this test every 5 years.  You may need to have your cholesterol levels checked more often if:  Your lipid or cholesterol levels are high.  You are older than 78 years of age.  You are at high risk for heart disease.  CANCER SCREENING   Lung Cancer  Lung cancer screening is recommended for adults 24-15 years old who are at high risk for lung cancer because of a history of smoking.  A yearly low-dose CT scan of the lungs is recommended for people who:  Currently smoke.  Have quit within the past 15 years.  Have at least a 30-pack-year history of  smoking. A pack year is smoking an average of one pack of cigarettes a day for 1 year.  Yearly screening should continue until it has been 15 years since you quit.  Yearly screening should stop if you develop a health problem that would prevent you from having lung cancer treatment.  Breast Cancer  Practice breast self-awareness. This means understanding how your breasts normally appear and feel.  It also means doing regular breast self-exams. Let your health care provider know about any changes, no matter how small.  If you are in your 20s or 30s, you should have a clinical breast exam (CBE) by a health care provider every 1-3 years as part of a regular health exam.  If you are 2 or older, have a CBE every year. Also consider having a breast X-ray (mammogram) every year.  If you have a family history of breast cancer, talk to your health care provider about genetic screening.  If you are at high risk for breast cancer, talk to your health care provider about having an MRI and a mammogram every year.  Breast cancer gene (BRCA) assessment is recommended for women who have family members with BRCA-related cancers. BRCA-related cancers include:  Breast.  Ovarian.  Tubal.  Peritoneal cancers.  Results of the assessment will determine the need for genetic counseling and BRCA1 and BRCA2 testing. Cervical Cancer Routine pelvic examinations to screen for cervical cancer are no longer recommended for nonpregnant women who are considered low risk for cancer of the pelvic organs (ovaries, uterus, and vagina) and who do not have symptoms. A pelvic examination may be necessary if you have symptoms including those associated with pelvic infections. Ask your health care provider if a screening pelvic exam is right for you.   The Pap test is the screening test for cervical cancer for  women who are considered at risk.  If you had a hysterectomy for a problem that was not cancer or a condition  that could lead to cancer, then you no longer need Pap tests.  If you are older than 65 years, and you have had normal Pap tests for the past 10 years, you no longer need to have Pap tests.  If you have had past treatment for cervical cancer or a condition that could lead to cancer, you need Pap tests and screening for cancer for at least 20 years after your treatment.  If you no longer get a Pap test, assess your risk factors if they change (such as having a new sexual partner). This can affect whether you should start being screened again.  Some women have medical problems that increase their chance of getting cervical cancer. If this is the case for you, your health care provider may recommend more frequent screening and Pap tests.  The human papillomavirus (HPV) test is another test that may be used for cervical cancer screening. The HPV test looks for the virus that can cause cell changes in the cervix. The cells collected during the Pap test can be tested for HPV.  The HPV test can be used to screen women 8 years of age and older. Getting tested for HPV can extend the interval between normal Pap tests from three to five years.  An HPV test also should be used to screen women of any age who have unclear Pap test results.  After 78 years of age, women should have HPV testing as often as Pap tests.  Colorectal Cancer  This type of cancer can be detected and often prevented.  Routine colorectal cancer screening usually begins at 78 years of age and continues through 78 years of age.  Your health care provider may recommend screening at an earlier age if you have risk factors for colon cancer.  Your health care provider may also recommend using home test kits to check for hidden blood in the stool.  A small camera at the end of a tube can be used to examine your colon directly (sigmoidoscopy or colonoscopy). This is done to check for the earliest forms of colorectal cancer.  Routine  screening usually begins at age 37.  Direct examination of the colon should be repeated every 5-10 years through 78 years of age. However, you may need to be screened more often if early forms of precancerous polyps or small growths are found. Skin Cancer  Check your skin from head to toe regularly.  Tell your health care provider about any new moles or changes in moles, especially if there is a change in a mole's shape or color.  Also tell your health care provider if you have a mole that is larger than the size of a pencil eraser.  Always use sunscreen. Apply sunscreen liberally and repeatedly throughout the day.  Protect yourself by wearing long sleeves, pants, a wide-brimmed hat, and sunglasses whenever you are outside. HEART DISEASE, DIABETES, AND HIGH BLOOD PRESSURE   Have your blood pressure checked at least every 1-2 years. High blood pressure causes heart disease and increases the risk of stroke.  If you are between 88 years and 60 years old, ask your health care provider if you should take aspirin to prevent strokes.  Have regular diabetes screenings. This involves taking a blood sample to check your fasting blood sugar level.  If you are at a normal weight and have a  low risk for diabetes, have this test once every three years after 78 years of age.  If you are overweight and have a high risk for diabetes, consider being tested at a younger age or more often. PREVENTING INFECTION  Hepatitis B  If you have a higher risk for hepatitis B, you should be screened for this virus. You are considered at high risk for hepatitis B if:  You were born in a country where hepatitis B is common. Ask your health care provider which countries are considered high risk.  Your parents were born in a high-risk country, and you have not been immunized against hepatitis B (hepatitis B vaccine).  You have HIV or AIDS.  You use needles to inject street drugs.  You live with someone who has  hepatitis B.  You have had sex with someone who has hepatitis B.  You get hemodialysis treatment.  You take certain medicines for conditions, including cancer, organ transplantation, and autoimmune conditions. Hepatitis C  Blood testing is recommended for:  Everyone born from [birth year] through 1963.  Anyone with known risk factors for hepatitis C. Sexually transmitted infections (STIs)  You should be screened for sexually transmitted infections (STIs) including gonorrhea and chlamydia if:  You are sexually active and are younger than 78 years of age.  You are older than 78 years of age and your health care provider tells you that you are at risk for this type of infection.  Your sexual activity has changed since you were last screened and you are at an increased risk for chlamydia or gonorrhea. Ask your health care provider if you are at risk.  If you do not have HIV, but are at risk, it may be recommended that you take a prescription medicine daily to prevent HIV infection. This is called pre-exposure prophylaxis (PrEP). You are considered at risk if:  You are sexually active and do not regularly use condoms or know the HIV status of your partner(s).  You take drugs by injection.  You are sexually active with a partner who has HIV. Talk with your health care provider about whether you are at high risk of being infected with HIV. If you choose to begin PrEP, you should first be tested for HIV. You should then be tested every 3 months for as long as you are taking PrEP.  PREGNANCY   If you are premenopausal and you may become pregnant, ask your health care provider about preconception counseling.  If you may become pregnant, take 400 to 800 micrograms (mcg) of folic acid every day.  If you want to prevent pregnancy, talk to your health care provider about birth control (contraception). OSTEOPOROSIS AND MENOPAUSE   Osteoporosis is a disease in which the bones lose minerals and  strength with aging. This can result in serious bone fractures. Your risk for osteoporosis can be identified using a bone density scan.  If you are 80 years of age or older, or if you are at risk for osteoporosis and fractures, ask your health care provider if you should be screened.  Ask your health care provider whether you should take a calcium or vitamin D supplement to lower your risk for osteoporosis.  Menopause may have certain physical symptoms and risks.  Hormone replacement therapy may reduce some of these symptoms and risks. Talk to your health care provider about whether hormone replacement therapy is right for you.  HOME CARE INSTRUCTIONS   Schedule regular health, dental, and eye exams.  Stay  current with your immunizations.   Do not use any tobacco products including cigarettes, chewing tobacco, or electronic cigarettes.  If you are pregnant, do not drink alcohol.  If you are breastfeeding, limit how much and how often you drink alcohol.  Limit alcohol intake to no more than 1 drink per day for nonpregnant women. One drink equals 12 ounces of beer, 5 ounces of wine, or 1 ounces of hard liquor.  Do not use street drugs.  Do not share needles.  Ask your health care provider for help if you need support or information about quitting drugs.  Tell your health care provider if you often feel depressed.  Tell your health care provider if you have ever been abused or do not feel safe at home. Document Released: 08/29/2010 Document Revised: 06/30/2013 Document Reviewed: 01/15/2013 [facility] Patient Information 2015 [place], [place]. This information is not intended to replace advice given to you by your health care provider. Make sure you discuss any questions you have with your health care provider.

## 2013-12-22 NOTE — Progress Notes (Signed)
Patient ID: Brianna Perry, female   DOB: [birth year], 80 y.o.   MRN: [id number]  The patient is here for annual Medicare wellness examination and management of other chronic and acute problems as follows:   Obesity.  She has been cutting out "white" food but  Has not lost any weight .  She is limited to walking short distances for exercise because she develops  LOWER  BACK TO SPASM AFTERWAR the pain   RADIATES TO LEFT KNEE,  Plain films of her lumbar spine which were done in October 2014 indicated SEVERE DEGENERATIVe CHANGES. She has not had an MRI in over 5 years,  The last one being done in [facility] and not available for review.  Nocturia x 4 or more occurring for the last several years.  takes torsemide , avoids liquids after   Shoulder pain:  She has been having pain in her left shoulder for several weeks.  She has a history of SCOLIOSIS  Chronic insomnia.  She continues to rely on nightly use of Halcion      The risk factors are reflected in the social history.  The roster of all physicians providing medical care to patient - is listed in the Snapshot section of the chart.  Activities of daily living:  The patient is 100% independent in all ADLs: dressing, toileting, feeding as well as independent mobility  Home safety : The patient has smoke detectors in the home. They wear seatbelts.  There are no firearms at home. There is no violence in the home.   There is no risks for hepatitis, STDs or HIV. There is no   history of blood transfusion. They have no travel history to infectious disease endemic areas of the world.  The patient has seen their dentist in the last six month. They have seen their eye doctor in the last year. They admit to slight hearing difficulty with regard to whispered voices and some television programs.  They have deferred audiologic testing in the last year.  They do not  have excessive sun exposure. Discussed the need for sun protection: hats, long sleeves and  use of sunscreen if there is significant sun exposure.   Diet: the importance of a healthy diet is discussed. They do have a healthy diet, but she is frustrated by an inability to lose weight.    The benefits of regular aerobic exercise were discussed. She DOES NOT EXERCISE OR DO YARD WORK DUE TO SEVERE BACK PAIN  .   Depression screen: there are no signs or vegative symptoms of depression- irritability, change in appetite, anhedonia, sadness/tearfullness.  Cognitive assessment: the patient manages all their financial and personal affairs and is actively engaged. They could relate day,date,year and events; recalled 2/3 objects at 3 minutes; performed clock-face test normally.  The following portions of the patient's history were reviewed and updated as appropriate: allergies, current medications, past family history, past medical history,  past surgical history, past social history  and problem list.  Visual acuity was not assessed per patient preference since she has regular follow up with her ophthalmologist. Hearing and body mass index were assessed and reviewed.   During the course of the visit the patient was educated and counseled about appropriate screening and preventive services including : fall prevention , diabetes screening, nutrition counseling, colorectal cancer screening, and recommended immunizations.    Objective:   BP 140/80  Pulse 71  Temp(Src) 97.8 F (36.6 C) (Oral)  Resp 16  Ht 5\' 3"  (  1.6 m)  Wt 195 lb 12 oz (88.792 kg)  BMI 34.68 kg/m2  SpO2 97%  General appearance: alert, cooperative and appears stated age Head: Normocephalic, without obvious abnormality, atraumatic Eyes: conjunctivae/corneas clear. PERRL, EOM's intact. Fundi benign. Ears: normal TM's and external ear canals both ears Nose: Nares normal. Septum midline. Mucosa normal. No drainage or sinus tenderness. Throat: lips, mucosa, and tongue normal; teeth and gums normal Neck: no adenopathy, no  carotid bruit, no JVD, supple, symmetrical, trachea midline and thyroid not enlarged, symmetric, no tenderness/mass/nodules Lungs: clear to auscultation bilaterally Breasts: normal appearance, no masses or tenderness Heart: regular rate and rhythm, S1, S2 normal, no murmur, click, rub or gallop Abdomen: soft, non-tender; bowel sounds normal; no masses,  no organomegaly Extremities: extremities normal, atraumatic, no cyanosis or edema Pulses: 2+ and symmetric Skin: Skin color, texture, turgor normal. No rashes or lesions Neurologic: Alert and oriented X 3, normal strength and tone. Normal symmetric reflexes. Normal coordination and gait.   Assessment and Plan:  Low back pain radiating to left leg Secondary to severe DJD complicated by scoliosis. Frequent muscle spasms prevent her from walking.  Will need MRI to assess severeity.  Tramadol and robaxin for prn use. Straight leg let is positive today   Personal history of breast cancer Right breast diagnosed in 2010.  She is S/p lumpectomy and XRT in [place].  She has been taking tamoxifen since June 2010.  She was referred to Dr Grayland Ormond at [facility] last year for oncology follow up   Chronic knee pain Unclear how much is secondaty to DJD knee vs referred pain from lower spine.  Records requested,  No effusion today ,  Plain films offered.   Chronic insomnia Continue halcion with refills given. Cannot sleep without it,  Prior trials of OTC meds ,  Ambien,  Were not effective.    Nocturia more than twice per night No evidence of UTI.  Trial of oxybutynin.   Obesity I have addressed  BMI and recommended wt loss of 10% of body weigh over the next 6 months using a low glycemic index diet and water aerobics for regular exercise with a goal of 5 days per week.    Medicare annual wellness visit, subsequent Annual Medicare wellness  exam was done as well as a comprehensive physical exam and management of acute and chronic conditions .  During the  course of the visit the patient was educated and counseled about appropriate screening and preventive services including : fall prevention , diabetes screening, nutrition counseling, colorectal cancer screening, and recommended immunizations.  Printed recommendations for health maintenance screenings was given.   Degenerative disk disease Her pain is preventing her from engaging in regular exercise .  Encouraged her to consider repeat MRI and treatment    Updated Medication List Outpatient Encounter Prescriptions as of 12/22/2013  Medication Sig  . aspirin 81 MG tablet Take 81 mg by mouth daily.  . calcium carbonate (OS-CAL) 600 MG TABS tablet Take 1,200 mg by mouth daily.  . Multiple Vitamin (MULTIVITAMIN) capsule Take 1 capsule by mouth daily.  . Omega-3 Fatty Acids (FISH OIL) 300 MG CAPS Take 1 capsule by mouth daily.  [name] omeprazole (PRILOSEC) 20 MG capsule TAKE ONE CAPSULE BY MOUTH IN THE MORNING  . torsemide (DEMADEX) 20 MG tablet Take 20 mg by mouth as needed.  . triazolam (HALCION) 0.25 MG tablet Take 1 tablet (0.25 mg total) by mouth at bedtime as needed.  . [DISCONTINUED] triazolam (HALCION) 0.25 MG tablet  Take 1 tablet (0.25 mg total) by mouth at bedtime as needed.  . methocarbamol (ROBAXIN) 500 MG tablet TAKE 1 TABLET BY MOUTH EVERY 8 HOURS AS NEEDED FOR MUSCLE SPASM  . oxybutynin (DITROPAN) 5 MG tablet Take 1 tablet (5 mg total) by mouth every 8 (eight) hours as needed for bladder spasms. And urinary fequency  . traMADol (ULTRAM) 50 MG tablet Take 1 tablet (50 mg total) by mouth every 8 (eight) hours as needed.  . [DISCONTINUED] tamoxifen (NOLVADEX) 20 MG tablet Take 1 tablet by mouth daily.  . [DISCONTINUED] TDaP (BOOSTRIX) 5-2.5-18.5 LF-MCG/0.5 injection Inject 0.5 mLs into the muscle once.

## 2013-12-24 ENCOUNTER — Encounter: Payer: Self-pay | Admitting: Internal Medicine

## 2013-12-24 DIAGNOSIS — M79605 Pain in left leg: Secondary | ICD-10-CM

## 2013-12-24 DIAGNOSIS — L989 Disorder of the skin and subcutaneous tissue, unspecified: Secondary | ICD-10-CM

## 2013-12-24 DIAGNOSIS — M545 Low back pain: Secondary | ICD-10-CM | POA: Insufficient documentation

## 2013-12-24 DIAGNOSIS — E669 Obesity, unspecified: Secondary | ICD-10-CM | POA: Insufficient documentation

## 2013-12-24 DIAGNOSIS — Z0000 Encounter for general adult medical examination without abnormal findings: Secondary | ICD-10-CM | POA: Insufficient documentation

## 2013-12-24 MED ORDER — OXYBUTYNIN CHLORIDE 5 MG PO TABS: 5.0000 mg | ORAL_TABLET | Freq: Three times a day (TID) | ORAL | Status: DC | PRN

## 2013-12-24 NOTE — Assessment & Plan Note (Signed)

## 2013-12-24 NOTE — Assessment & Plan Note (Signed)
Her pain is preventing her from engaging in regular exercise .  Encouraged her to consider repeat MRI and treatment

## 2013-12-24 NOTE — Assessment & Plan Note (Signed)
Continue halcion with refills given. Cannot sleep without it,  Prior trials of OTC meds ,  Ambien,  Were not effective.

## 2013-12-24 NOTE — Assessment & Plan Note (Signed)
I have addressed  BMI and recommended wt loss of 10% of body weigh over the next 6 months using a low glycemic index diet and water aerobics for regular exercise with a goal of 5 days per week.

## 2013-12-24 NOTE — Assessment & Plan Note (Signed)
No evidence of UTI.  Trial of oxybutynin.

## 2013-12-24 NOTE — Assessment & Plan Note (Signed)
Right breast diagnosed in 2010.  She is S/p lumpectomy and XRT in [place].  She has been taking tamoxifen since June 2010.  She was referred to Dr Grayland Ormond at [facility] last year for oncology follow up

## 2013-12-24 NOTE — Assessment & Plan Note (Signed)
Unclear how much is secondaty to DJD knee vs referred pain from lower spine.  Records requested,  No effusion today ,  Plain films offered.

## 2013-12-24 NOTE — Assessment & Plan Note (Signed)
Secondary to severe DJD complicated by scoliosis. Frequent muscle spasms prevent her from walking.  Will need MRI to assess severeity.  Tramadol and robaxin for prn use. Straight leg let is positive today

## 2013-12-29 ENCOUNTER — Ambulatory Visit: Payer: Self-pay | Admitting: Internal Medicine

## 2013-12-29 DIAGNOSIS — M5126 Other intervertebral disc displacement, lumbar region: Secondary | ICD-10-CM | POA: Diagnosis not present

## 2013-12-29 DIAGNOSIS — M47896 Other spondylosis, lumbar region: Secondary | ICD-10-CM | POA: Diagnosis not present

## 2013-12-29 DIAGNOSIS — M47812 Spondylosis without myelopathy or radiculopathy, cervical region: Secondary | ICD-10-CM | POA: Diagnosis not present

## 2014-01-01 ENCOUNTER — Telehealth: Payer: Self-pay | Admitting: Internal Medicine

## 2014-01-01 NOTE — Telephone Encounter (Signed)
Your MRI of the lumbar spine  suggested that  degenerative changes at multiple levels have caused bulging disks at multiple levels but not enough to cause spinal stenosis or nerve root impingement.   I advise you to incorporate core strengthening exercises into your regular exercise routine and avoid lifting or laboring in a way that places undue weight bearing and stress on lower spine. If you would like a referral to Physical Therapy for help in learning these exercises ,   I can make referral

## 2014-01-27 ENCOUNTER — Encounter: Payer: Self-pay | Admitting: Internal Medicine

## 2014-03-02 ENCOUNTER — Other Ambulatory Visit: Payer: Self-pay | Admitting: Internal Medicine

## 2014-03-02 DIAGNOSIS — L821 Other seborrheic keratosis: Secondary | ICD-10-CM | POA: Diagnosis not present

## 2014-03-02 DIAGNOSIS — L718 Other rosacea: Secondary | ICD-10-CM | POA: Diagnosis not present

## 2014-03-04 ENCOUNTER — Telehealth: Payer: Self-pay | Admitting: *Deleted

## 2014-03-04 NOTE — Telephone Encounter (Signed)
Fax from [facility], needing PA for Triazolam. Started online, sent mychart to patient for further information to complete PA.

## 2014-03-05 NOTE — Telephone Encounter (Signed)
PA completed online, pending response. 

## 2014-03-17 ENCOUNTER — Ambulatory Visit: Payer: Self-pay | Admitting: Oncology

## 2014-03-17 DIAGNOSIS — Z853 Personal history of malignant neoplasm of breast: Secondary | ICD-10-CM | POA: Diagnosis not present

## 2014-03-17 DIAGNOSIS — N63 Unspecified lump in breast: Secondary | ICD-10-CM | POA: Diagnosis not present

## 2014-03-17 LAB — HM MAMMOGRAPHY

## 2014-03-18 ENCOUNTER — Ambulatory Visit: Payer: Self-pay | Admitting: Oncology

## 2014-05-11 ENCOUNTER — Encounter: Payer: Self-pay | Admitting: Internal Medicine

## 2014-05-24 ENCOUNTER — Other Ambulatory Visit: Payer: Self-pay | Admitting: Internal Medicine

## 2014-06-03 ENCOUNTER — Other Ambulatory Visit: Payer: Self-pay | Admitting: Internal Medicine

## 2014-06-03 NOTE — Telephone Encounter (Signed)
Ok to refill,  Authorized in epic 

## 2014-06-03 NOTE — Telephone Encounter (Signed)
Rx phoned into pharmacy.

## 2014-06-03 NOTE — Telephone Encounter (Signed)
Last visit 12/22/13, has appt already scheduled 06/24/14, ok refill?

## 2014-06-24 ENCOUNTER — Ambulatory Visit (INDEPENDENT_AMBULATORY_CARE_PROVIDER_SITE_OTHER): Payer: Medicare Other | Admitting: Internal Medicine

## 2014-06-24 ENCOUNTER — Encounter: Payer: Self-pay | Admitting: Internal Medicine

## 2014-06-24 VITALS — BP 128/74 | HR 64 | Temp 97.6°F | Resp 14 | Ht 63.0 in | Wt 190.0 lb

## 2014-06-24 DIAGNOSIS — H8110 Benign paroxysmal vertigo, unspecified ear: Secondary | ICD-10-CM

## 2014-06-24 DIAGNOSIS — Z853 Personal history of malignant neoplasm of breast: Secondary | ICD-10-CM | POA: Diagnosis not present

## 2014-06-24 DIAGNOSIS — E669 Obesity, unspecified: Secondary | ICD-10-CM

## 2014-06-24 DIAGNOSIS — G4700 Insomnia, unspecified: Secondary | ICD-10-CM | POA: Diagnosis not present

## 2014-06-24 DIAGNOSIS — F5104 Psychophysiologic insomnia: Secondary | ICD-10-CM

## 2014-06-24 DIAGNOSIS — Z79899 Other long term (current) drug therapy: Secondary | ICD-10-CM

## 2014-06-24 LAB — COMPREHENSIVE METABOLIC PANEL
ALT: 25 U/L (ref 0–35)
AST: 24 U/L (ref 0–37)
Albumin: 4.2 g/dL (ref 3.5–5.2)
Alkaline Phosphatase: 90 U/L (ref 39–117)
BILIRUBIN TOTAL: 0.4 mg/dL (ref 0.2–1.2)
BUN: 18 mg/dL (ref 6–23)
CALCIUM: 9.8 mg/dL (ref 8.4–10.5)
CHLORIDE: 102 meq/L (ref 96–112)
CO2: 30 meq/L (ref 19–32)
Creatinine, Ser: 0.62 mg/dL (ref 0.40–1.20)
GFR: 98.45 mL/min (ref >60.00)
GLUCOSE: 91 mg/dL (ref 70–99)
Potassium: 4.1 mEq/L (ref 3.5–5.1)
SODIUM: 137 meq/L (ref 135–145)
TOTAL PROTEIN: 7.3 g/dL (ref 6.0–8.3)

## 2014-06-24 NOTE — Progress Notes (Signed)
Patient ID: Brianna Perry, female   DOB: 03-20-1932, 81 y.o.   MRN: [id number]   Patient Active Problem List   Diagnosis Date Noted  . BPV (benign positional vertigo) 06/27/2014  . Low back pain radiating to left leg 12/24/2013  . Obesity 12/24/2013  . Medicare annual wellness visit, subsequent 12/24/2013  . Chronic insomnia 12/22/2013  . Edema 12/22/2013  . Nocturia more than twice per night 12/22/2013  . Long-term use of high-risk medication 12/22/2013  . Personal history of breast cancer 11/27/2012  . Screening for colon cancer 11/27/2012  . GERD (gastroesophageal reflux disease) 11/27/2012  . Chronic knee pain 11/27/2012  . Degenerative disk disease 11/27/2012    Subjective:  CC:   Chief Complaint  Patient presents with  . Follow-up    6 month  . Diabetes    HPI:   Brianna Perry is a 81 y.o. female who presents for  Follow up on chrnonc issues including  chronic insomnia,  And History of breast CA.  She is now Off tamoxifen,  finished 5 years   She recently had a breast  ultrasound by Dr Grayland Ormond and is requesting that I review the report.  She has been notified of the resutls , but only told that they were "about the same"   Her chronic insomnia, manged with one dose of triazolam which has been effective .  Insurance will not pay for it .    New onset vertigo off and on for two months ,  Occurs when lying down to go to bed, lasting only a few seconds. She denies any recent falls.   Some wax building up in both ears     Past Medical History  Diagnosis Date  . Arthritis   . Cancer 2010    Right breast  . Chickenpox   . GERD (gastroesophageal reflux disease)   . Urine incontinence     Past Surgical History  Procedure Laterality Date  . Breast surgery Right 2010  . Abdominal hysterectomy  1968  . Tonsillectomy  1941       The following portions of the patient's history were reviewed and updated as appropriate: Allergies, current medications, and  problem list.    Review of Systems:   Patient denies headache, fevers, malaise, unintentional weight loss, skin rash, eye pain, sinus congestion and sinus pain, sore throat, dysphagia,  hemoptysis , cough, dyspnea, wheezing, chest pain, palpitations, orthopnea, edema, abdominal pain, nausea, melena, diarrhea, constipation, flank pain, dysuria, hematuria, urinary  Frequency, nocturia, numbness, tingling, seizures,  Focal weakness, Loss of consciousness,  Tremor, insomnia, depression, anxiety, and suicidal ideation.     History   Social History  . Marital Status: Married    Spouse Name: N/A  . Number of Children: N/A  . Years of Education: N/A   Occupational History  . Not on file.   Social History Main Topics  . Smoking status: Never Smoker   . Smokeless tobacco: Never Used  . Alcohol Use: No  . Drug Use: No  . Sexual Activity: Yes   Other Topics Concern  . Not on file   Social History Narrative    Objective:  Filed Vitals:   06/24/14 0936  BP: 128/74  Pulse: 64  Temp: 97.6 F (36.4 C)  Resp: 14     General appearance: alert, cooperative and appears stated age Ears: normal TM's and external ear canals both ears Throat: lips, mucosa, and tongue normal; teeth and gums normal Neck: no adenopathy, no  carotid bruit, supple, symmetrical, trachea midline and thyroid not enlarged, symmetric, no tenderness/mass/nodules Back: symmetric, no curvature. ROM normal. No CVA tenderness. Lungs: clear to auscultation bilaterally Heart: regular rate and rhythm, S1, S2 normal, no murmur, click, rub or gallop Abdomen: soft, non-tender; bowel sounds normal; no masses,  no organomegaly Pulses: 2+ and symmetric Skin: Skin color, texture, turgor normal. No rashes or lesions Lymph nodes: Cervical, supraclavicular, and axillary nodes normal. Neuro: CNs 2-12 intact. DTRs 2+/4 in biceps, brachioradialis, patellars and achilles. Muscle strength 5/5 in upper and lower exremities. Fine resting  tremor bilaterally both hands cerebellar function normal. Romberg negative.  No pronator drift.   Gait normal.    Assessment and Plan:  Personal history of breast cancer Right breast diagnosed in 2010.  She is S/p lumpectomy and XRT in [place].  She finished 5 years of tamoxifen in June 2015.  Repeat mammograms show no recurrence of ca .  Report reviewed with patient.    Chronic insomnia She has decided to pay out of pocket for triazolam.    Obesity I have congratulated her in reduction of   BMI and encouraged  Continued weight loss with goal of 10% of body weigh over the next 6 months using a low glycemic index diet and regular exercise a minimum of 5 days per week.     BPV (benign positional vertigo) neurologic exam is normal.  reassurance provided.    Updated Medication List Outpatient Encounter Prescriptions as of 06/24/2014  Medication Sig  . aspirin 81 MG tablet Take 81 mg by mouth daily.  . calcium carbonate (OS-CAL) 600 MG TABS tablet Take 1,200 mg by mouth daily.  . Multiple Vitamin (MULTIVITAMIN) capsule Take 1 capsule by mouth daily.  . Multiple Vitamins-Minerals (RA VISION-VITE PRESERVE PO) Take 1 tablet by mouth daily.  . Omega-3 Fatty Acids (FISH OIL) 300 MG CAPS Take 1 capsule by mouth daily.  [name] omeprazole (PRILOSEC) 20 MG capsule TAKE ONE CAPSULE BY MOUTH IN THE MORNING  . triazolam (HALCION) 0.25 MG tablet TAKE 1 TABLET BY MOUTH EVERY DAY AT BEDTIME AS NEEDED  . [DISCONTINUED] methocarbamol (ROBAXIN) 500 MG tablet TAKE 1 TABLET BY MOUTH EVERY 8 HOURS AS NEEDED FOR MUSCLE SPASM (Patient not taking: Reported on 06/24/2014)  . [DISCONTINUED] oxybutynin (DITROPAN) 5 MG tablet Take 1 tablet (5 mg total) by mouth every 8 (eight) hours as needed for bladder spasms. And urinary fequency (Patient not taking: Reported on 06/24/2014)  . [DISCONTINUED] torsemide (DEMADEX) 20 MG tablet Take 20 mg by mouth as needed.  . [DISCONTINUED] traMADol (ULTRAM) 50 MG tablet Take 1 tablet  (50 mg total) by mouth every 8 (eight) hours as needed. (Patient not taking: Reported on 06/24/2014)     Orders Placed This Encounter  Procedures  . HM DEXA SCAN  . HM MAMMOGRAPHY  . Comp Met (CMET)    No Follow-up on file.

## 2014-06-24 NOTE — Patient Instructions (Addendum)
You can try OTC claritin or Allegra for allergic rhinitis  It may help your vertigo  You can try using Debrox ear drops at night to manage the earwax.  You can also try famotidine  (pepcid) 20  mg once or twice daily instead of omeprazole  We have ordered your bone density test  I recommend getting the half of your calcium and Vitamin D  Requirements through dietary sources rather than supplements given the recent association of calcium supplements with increased coronary artery calcium scores (You need 1800 mg daily )   Unsweetened almond/coconut milk, Cashew and soy  Milks are all great low calorie low carb, cholesterol free sources of dietary calcium and vitamin D.      Benign Positional Vertigo Vertigo means you feel like you or your surroundings are moving when they are not. Benign positional vertigo is the most common form of vertigo. Benign means that the cause of your condition is not serious. Benign positional vertigo is more common in older adults. CAUSES  Benign positional vertigo is the result of an upset in the labyrinth system. This is an area in the middle ear that helps control your balance. This may be caused by a viral infection, head injury, or repetitive motion. However, often no specific cause is found. SYMPTOMS  Symptoms of benign positional vertigo occur when you move your head or eyes in different directions. Some of the symptoms may include:  Loss of balance and falls.  Vomiting.  Blurred vision.  Dizziness.  Nausea.  Involuntary eye movements (nystagmus). DIAGNOSIS  Benign positional vertigo is usually diagnosed by physical exam. If the specific cause of your benign positional vertigo is unknown, your caregiver may perform imaging tests, such as magnetic resonance imaging (MRI) or computed tomography (CT). TREATMENT  Your caregiver may recommend movements or procedures to correct the benign positional vertigo. Medicines such as meclizine, benzodiazepines,  and medicines for nausea may be used to treat your symptoms. In rare cases, if your symptoms are caused by certain conditions that affect the inner ear, you may need surgery. HOME CARE INSTRUCTIONS   Follow your caregiver's instructions.  Move slowly. Do not make sudden body or head movements.  Avoid driving.  Avoid operating heavy machinery.  Avoid performing any tasks that would be dangerous to you or others during a vertigo episode.  Drink enough fluids to keep your urine clear or pale yellow. SEEK IMMEDIATE MEDICAL CARE IF:   You develop problems with walking, weakness, numbness, or using your arms, hands, or legs.  You have difficulty speaking.  You develop severe headaches.  Your nausea or vomiting continues or gets worse.  You develop visual changes.  Your family or friends notice any behavioral changes.  Your condition gets worse.  You have a fever.  You develop a stiff neck or sensitivity to light. MAKE SURE YOU:   Understand these instructions.  Will watch your condition.  Will get help right away if you are not doing well or get worse. Document Released: 11/21/2005 Document Revised: 05/08/2011 Document Reviewed: 11/03/2010 [facility] Patient Information 2015 [place], [place]. This information is not intended to replace advice given to you by your health care provider. Make sure you discuss any questions you have with your health care provider.

## 2014-06-25 ENCOUNTER — Encounter: Payer: Self-pay | Admitting: Internal Medicine

## 2014-06-27 ENCOUNTER — Encounter: Payer: Self-pay | Admitting: Internal Medicine

## 2014-06-27 DIAGNOSIS — H8110 Benign paroxysmal vertigo, unspecified ear: Secondary | ICD-10-CM | POA: Insufficient documentation

## 2014-06-27 NOTE — Assessment & Plan Note (Signed)
neurologic exam is normal.  reassurance provided.

## 2014-06-27 NOTE — Assessment & Plan Note (Signed)
Right breast diagnosed in 2010.  She is S/p lumpectomy and XRT in [place].  She finished 5 years of tamoxifen in June 2015.  Repeat mammograms show no recurrence of ca .  Report reviewed with patient.

## 2014-06-27 NOTE — Assessment & Plan Note (Signed)
She has decided to pay out of pocket for triazolam.

## 2014-06-27 NOTE — Assessment & Plan Note (Signed)
I have congratulated her in reduction of   BMI and encouraged  Continued weight loss with goal of 10% of body weigh over the next 6 months using a low glycemic index diet and regular exercise a minimum of 5 days per week.    

## 2014-07-03 ENCOUNTER — Other Ambulatory Visit: Payer: Self-pay | Admitting: Internal Medicine

## 2014-07-03 NOTE — Telephone Encounter (Signed)
Ok to refill,  printed rx and faxed

## 2014-07-03 NOTE — Telephone Encounter (Signed)
Last OV 4.27.16, last refill 4.6.16.  Please advise refill

## 2014-07-14 ENCOUNTER — Ambulatory Visit
Admission: RE | Admit: 2014-07-14 | Discharge: 2014-07-14 | Disposition: A | Discharge status: Home / Self Care | Payer: Medicare Other | Source: Ambulatory Visit | Attending: Internal Medicine | Admitting: Internal Medicine

## 2014-07-14 DIAGNOSIS — Z13820 Encounter for screening for osteoporosis: Secondary | ICD-10-CM | POA: Diagnosis not present

## 2014-07-14 DIAGNOSIS — M8588 Other specified disorders of bone density and structure, other site: Secondary | ICD-10-CM | POA: Diagnosis not present

## 2014-07-14 DIAGNOSIS — M8580 Other specified disorders of bone density and structure, unspecified site: Secondary | ICD-10-CM | POA: Insufficient documentation

## 2014-07-15 ENCOUNTER — Encounter: Payer: Self-pay | Admitting: Internal Medicine

## 2014-07-15 LAB — HM DEXA SCAN

## 2014-07-17 NOTE — Telephone Encounter (Signed)
Mailed unread message to pt  

## 2014-07-28 ENCOUNTER — Encounter: Payer: Self-pay | Admitting: Internal Medicine

## 2014-08-05 ENCOUNTER — Other Ambulatory Visit: Payer: Self-pay | Admitting: *Deleted

## 2014-08-05 DIAGNOSIS — Z853 Personal history of malignant neoplasm of breast: Secondary | ICD-10-CM

## 2014-08-06 ENCOUNTER — Inpatient Hospital Stay: Payer: Medicare Other | Attending: Oncology

## 2014-08-06 ENCOUNTER — Inpatient Hospital Stay (HOSPITAL_BASED_OUTPATIENT_CLINIC_OR_DEPARTMENT_OTHER): Payer: Medicare Other | Admitting: Oncology

## 2014-08-06 VITALS — BP 152/83 | HR 73 | Temp 96.6°F | Resp 18 | Wt 190.7 lb

## 2014-08-06 DIAGNOSIS — Z79899 Other long term (current) drug therapy: Secondary | ICD-10-CM | POA: Diagnosis not present

## 2014-08-06 DIAGNOSIS — Z170 Estrogen receptor positive status [ER+]: Secondary | ICD-10-CM | POA: Diagnosis not present

## 2014-08-06 DIAGNOSIS — Z853 Personal history of malignant neoplasm of breast: Secondary | ICD-10-CM | POA: Diagnosis not present

## 2014-08-06 DIAGNOSIS — C50911 Malignant neoplasm of unspecified site of right female breast: Secondary | ICD-10-CM

## 2014-08-06 LAB — BASIC METABOLIC PANEL
Anion gap: 5 (ref 5–15)
BUN: 16 mg/dL (ref 6–20)
CALCIUM: 8.7 mg/dL — AB (ref 8.9–10.3)
CO2: 27 mmol/L (ref 22–32)
CREATININE: 0.62 mg/dL (ref 0.44–1.00)
Chloride: 103 mmol/L (ref 101–111)
GFR calc non Af Amer: >60 mL/min (ref >60)
GLUCOSE: 97 mg/dL (ref 65–99)
POTASSIUM: 3.5 mmol/L (ref 3.5–5.1)
Sodium: 135 mmol/L (ref 135–145)

## 2014-08-06 LAB — CBC WITH DIFFERENTIAL/PLATELET
Basophils Absolute: 0 10*3/uL (ref 0–0.1)
Basophils Relative: 1 %
Eosinophils Absolute: 0.1 10*3/uL (ref 0–0.7)
Eosinophils Relative: 1 %
HEMATOCRIT: 38 % (ref 35.0–47.0)
Hemoglobin: 12.3 g/dL (ref 12.0–16.0)
Lymphocytes Relative: 27 %
Lymphs Abs: 1.7 10*3/uL (ref 1.0–3.6)
MCH: 27.3 pg (ref 26.0–34.0)
MCHC: 32.3 g/dL (ref 32.0–36.0)
MCV: 84.5 fL (ref 80.0–100.0)
Monocytes Absolute: 0.6 10*3/uL (ref 0.2–0.9)
Monocytes Relative: 9 %
Neutro Abs: 4 10*3/uL (ref 1.4–6.5)
Neutrophils Relative %: 62 %
Platelets: 279 10*3/uL (ref 150–440)
RBC: 4.5 MIL/uL (ref 3.80–5.20)
RDW: 15.1 % — ABNORMAL HIGH (ref 11.5–14.5)
WBC: 6.5 10*3/uL (ref 3.6–11.0)

## 2014-08-07 LAB — CANCER ANTIGEN 27.29: CA 27.29: 26.5 U/mL (ref 0.0–38.6)

## 2014-08-11 ENCOUNTER — Ambulatory Visit
Admission: RE | Admit: 2014-08-11 | Discharge: 2014-08-11 | Disposition: A | Discharge status: Home / Self Care | Payer: Medicare Other | Source: Ambulatory Visit | Attending: Oncology | Admitting: Oncology

## 2014-08-11 ENCOUNTER — Other Ambulatory Visit: Payer: Self-pay | Admitting: Oncology

## 2014-08-11 DIAGNOSIS — C50911 Malignant neoplasm of unspecified site of right female breast: Secondary | ICD-10-CM

## 2014-08-11 DIAGNOSIS — Z853 Personal history of malignant neoplasm of breast: Secondary | ICD-10-CM | POA: Diagnosis not present

## 2014-08-11 DIAGNOSIS — R928 Other abnormal and inconclusive findings on diagnostic imaging of breast: Secondary | ICD-10-CM | POA: Diagnosis not present

## 2014-08-11 DIAGNOSIS — Z9882 Breast implant status: Secondary | ICD-10-CM | POA: Insufficient documentation

## 2014-08-11 HISTORY — DX: Unspecified lump in unspecified breast: N63.0

## 2014-08-11 HISTORY — DX: Malignant neoplasm of unspecified site of unspecified female breast: C50.919

## 2014-08-30 NOTE — Progress Notes (Signed)
[place]  [phone] [phone]  ID: VAUDA SALVUCCI OB: 1933/01/06  MR#: [id number]  UVO#:[id number]  Patient Care Team: Crecencio Mc, MD as PCP - General (Internal Medicine)  CHIEF COMPLAINT:  Chief Complaint  Patient presents with  . Follow-up    breast cancer    INTERVAL HISTORY: Patient returns to clinic for routine six-month follow-up. She completed 5 years of tamoxifen in June 2015. She currently feels well and is asymptomatic. She is anxious about a nodule in her right breast. She has no neurologic complaints.  She denies any recent fevers or illnesses.  She has no chest pain or shortness of breath.  She denies any nausea, vomiting, constipation, or diarrhea.  She has no urinary complaints.  Patient offers no further specific complaints.   REVIEW OF SYSTEMS:   Review of Systems  Constitutional: Negative.   Respiratory: Negative.   Cardiovascular: Negative.   Psychiatric/Behavioral: The patient is nervous/anxious.     As per HPI. Otherwise, a complete review of systems is negatve.  PAST MEDICAL HISTORY: Past Medical History  Diagnosis Date  . Arthritis   . Cancer 2010    Right breast  . Chickenpox   . GERD (gastroesophageal reflux disease)   . Urine incontinence   . Breast mass x couple months    Right Breast  . Breast cancer 2010    Right Breast c lumpectomy c radiation    PAST SURGICAL HISTORY: Past Surgical History  Procedure Laterality Date  . Breast surgery Right 2010  . Abdominal hysterectomy  1968  . Tonsillectomy  1941  . Breast cyst aspiration Left 2015    Negative  . Augmentation mammaplasty Bilateral 1973    FAMILY HISTORY Family History  Problem Relation Age of Onset  . Arthritis Mother   . Heart disease Mother   . Hypertension Mother   . Heart disease Father   . Breast cancer Paternal Aunt     22s       ADVANCED DIRECTIVES:    HEALTH MAINTENANCE: History  Substance Use Topics  .  Smoking status: Never Smoker   . Smokeless tobacco: Never Used  . Alcohol Use: No     Colonoscopy:  PAP:  Bone density:  Lipid panel:  Allergies  Allergen Reactions  . Penicillins Swelling    Current Outpatient Prescriptions  Medication Sig Dispense Refill  . aspirin 81 MG tablet Take 81 mg by mouth daily.    . calcium carbonate (OS-CAL) 600 MG TABS tablet Take 1,200 mg by mouth daily.    . Multiple Vitamin (MULTIVITAMIN) capsule Take 1 capsule by mouth daily.    . Multiple Vitamins-Minerals (RA VISION-VITE PRESERVE PO) Take 1 tablet by mouth daily.    . Omega-3 Fatty Acids (FISH OIL) 300 MG CAPS Take 1 capsule by mouth daily.    [name] omeprazole (PRILOSEC) 20 MG capsule TAKE ONE CAPSULE BY MOUTH IN THE MORNING 90 capsule 1  . triazolam (HALCION) 0.25 MG tablet TAKE 1 TABLET BY MOUTH EVERY DAY AT BEDTIME AS NEEDED 30 tablet 5   No current facility-administered medications for this visit.    OBJECTIVE: Filed Vitals:   08/06/14 1054  BP: 152/83  Pulse: 73  Temp: 96.6 F (35.9 C)  Resp: 18     Body mass index is 33.79 kg/(m^2).    ECOG FS:0 - Asymptomatic  General: Well-developed, well-nourished, no acute distress. Eyes: anicteric sclera. Breasts: Subcentimeter nodule on right breast appears to be scar tissue. Left breast  and axilla without lumps or masses. Lungs: Clear to auscultation bilaterally. Heart: Regular rate and rhythm. No rubs, murmurs, or gallops. Abdomen: Soft, nontender, nondistended. No organomegaly noted, normoactive bowel sounds. Musculoskeletal: No edema, cyanosis, or clubbing. Neuro: Alert, answering all questions appropriately. Cranial nerves grossly intact. Skin: No rashes or petechiae noted. Psych: Normal affect.    LAB RESULTS:  Lab Results  Component Value Date   NA 135 08/06/2014   K 3.5 08/06/2014   CL 103 08/06/2014   CO2 27 08/06/2014   GLUCOSE 97 08/06/2014   BUN 16 08/06/2014   CREATININE 0.62 08/06/2014   CALCIUM 8.7* 08/06/2014    PROT 7.3 06/24/2014   ALBUMIN 4.2 06/24/2014   AST 24 06/24/2014   ALT 25 06/24/2014   ALKPHOS 90 06/24/2014   BILITOT 0.4 06/24/2014   GFRNONAA >60 08/06/2014   GFRAA >60 08/06/2014    Lab Results  Component Value Date   WBC 6.5 08/06/2014   NEUTROABS 4.0 08/06/2014   HGB 12.3 08/06/2014   HCT 38.0 08/06/2014   MCV 84.5 08/06/2014   PLT 279 08/06/2014     STUDIES: US Breast Ltd Uni Right Inc Axilla  08/11/2014   CLINICAL DATA:  Patient presents for evaluation of palpable abnormality within the 1 o'clock position right breast.  EXAM: DIGITAL DIAGNOSTIC RIGHT MAMMOGRAM WITH IMPLANTS, 3D TOMOSYNTHESIS AND CAD  ULTRASOUND RIGHT BREAST  The patient has retroglandular implants. Standard and implant displaced views were performed.  COMPARISON:  [facility]  ACR Breast Density Category b: There are scattered areas of fibroglandular density.  FINDINGS: Stable postlumpectomy changes right breast. No concerning masses, calcifications or nonsurgical architectural distortion identified.  On physical exam, I palpate no discrete mass within the 1 o'clock position right breast.  Targeted ultrasound is performed, showing normal tissue within the 1 o'clock position right breast anterior depth at the patient reported site of palpable abnormality.  Mammographic images were processed with CAD.  IMPRESSION: No mammographic evidence for malignancy.  RECOMMENDATION: Continued clinical evaluation for reported right breast palpable abnormality.  Return to bilateral diagnostic mammography 02/2015.  I have discussed the findings and recommendations with the patient. Results were also provided in writing at the conclusion of the visit. If applicable, a reminder letter will be sent to the patient regarding the next appointment.  BI-RADS CATEGORY  2: Benign.   Electronically Signed   By: Lovey Newcomer M.D.   On: 08/11/2014 10:22   Mm Diag Breast Tomo Uni Right  08/11/2014   CLINICAL DATA:  Patient presents for evaluation of  palpable abnormality within the 1 o'clock position right breast.  EXAM: DIGITAL DIAGNOSTIC RIGHT MAMMOGRAM WITH IMPLANTS, 3D TOMOSYNTHESIS AND CAD  ULTRASOUND RIGHT BREAST  The patient has retroglandular implants. Standard and implant displaced views were performed.  COMPARISON:  [facility]  ACR Breast Density Category b: There are scattered areas of fibroglandular density.  FINDINGS: Stable postlumpectomy changes right breast. No concerning masses, calcifications or nonsurgical architectural distortion identified.  On physical exam, I palpate no discrete mass within the 1 o'clock position right breast.  Targeted ultrasound is performed, showing normal tissue within the 1 o'clock position right breast anterior depth at the patient reported site of palpable abnormality.  Mammographic images were processed with CAD.  IMPRESSION: No mammographic evidence for malignancy.  RECOMMENDATION: Continued clinical evaluation for reported right breast palpable abnormality.  Return to bilateral diagnostic mammography 02/2015.  I have discussed the findings and recommendations with the patient. Results were also provided in writing at the  conclusion of the visit. If applicable, a reminder letter will be sent to the patient regarding the next appointment.  BI-RADS CATEGORY  2: Benign.   Electronically Signed   By: Lovey Newcomer M.D.   On: 08/11/2014 10:22    ASSESSMENT:  DCIS.   PLAN:    1.  DCIS: Right breast mammogram and ultrasound as above with no suspicious abnormalities. Her most recent bilateral mammogram was reported as BI-RADS 2 in January 2016. Patient is now greater than 5 years out from her diagnosis and does not require any further follow-up. Have recommended that she continue to get heard yearly mammograms which can now be ordered by her primary care physician.   Patient expressed understanding and was in agreement with this plan. She also understands that She can call clinic at any time with any questions,  concerns, or complaints.    Lloyd Huger, MD   08/30/2014 1:08 PM

## 2014-09-02 ENCOUNTER — Other Ambulatory Visit (INDEPENDENT_AMBULATORY_CARE_PROVIDER_SITE_OTHER): Payer: Medicare Other | Admitting: *Deleted

## 2014-09-02 DIAGNOSIS — R399 Unspecified symptoms and signs involving the genitourinary system: Secondary | ICD-10-CM

## 2014-09-02 LAB — URINALYSIS, ROUTINE W REFLEX MICROSCOPIC
Bilirubin Urine: NEGATIVE
KETONES UR: NEGATIVE
Nitrite: POSITIVE — AB
Specific Gravity, Urine: 1.025 (ref 1.000–1.030)
Total Protein, Urine: NEGATIVE
Urine Glucose: NEGATIVE
Urobilinogen, UA: 0.2 (ref 0.0–1.0)
pH: 5.5 (ref 5.0–8.0)

## 2014-09-02 LAB — POCT URINALYSIS DIPSTICK
Bilirubin, UA: NEGATIVE
GLUCOSE UA: NEGATIVE
Ketones, UA: NEGATIVE
Nitrite, UA: NEGATIVE
Protein, UA: NEGATIVE
SPEC GRAV UA: 1.025
UROBILINOGEN UA: 0.2
pH, UA: 5.5

## 2014-09-03 ENCOUNTER — Encounter: Payer: Self-pay | Admitting: Internal Medicine

## 2014-09-03 ENCOUNTER — Other Ambulatory Visit: Payer: Self-pay | Admitting: Internal Medicine

## 2014-09-03 MED ORDER — PHENAZOPYRIDINE HCL 200 MG PO TABS
200.0000 mg | ORAL_TABLET | Freq: Three times a day (TID) | ORAL | Status: DC | PRN
Start: 2014-09-03 — End: 2014-12-28

## 2014-09-03 MED ORDER — CIPROFLOXACIN HCL 250 MG PO TABS: 250.0000 mg | ORAL_TABLET | Freq: Two times a day (BID) | ORAL | Status: DC

## 2014-09-03 NOTE — Addendum Note (Signed)
Addended by: Crecencio Mc on: 09/03/2014 09:31 PM   Modules accepted: Orders

## 2014-09-05 ENCOUNTER — Encounter: Payer: Self-pay | Admitting: Internal Medicine

## 2014-09-05 LAB — URINE CULTURE

## 2014-09-25 NOTE — Telephone Encounter (Signed)
Mailed unread my chart message to patient

## 2014-10-09 ENCOUNTER — Encounter: Payer: Self-pay | Admitting: Internal Medicine

## 2014-11-09 ENCOUNTER — Encounter: Payer: Self-pay | Admitting: Internal Medicine

## 2014-11-10 NOTE — Telephone Encounter (Signed)
Tried to reach patient by phone no answer

## 2014-11-23 ENCOUNTER — Other Ambulatory Visit: Payer: Self-pay | Admitting: Internal Medicine

## 2014-12-07 ENCOUNTER — Encounter: Payer: Self-pay | Admitting: Internal Medicine

## 2014-12-10 ENCOUNTER — Encounter: Payer: Self-pay | Admitting: Internal Medicine

## 2014-12-21 ENCOUNTER — Telehealth: Payer: Self-pay | Admitting: *Deleted

## 2014-12-21 ENCOUNTER — Other Ambulatory Visit (INDEPENDENT_AMBULATORY_CARE_PROVIDER_SITE_OTHER): Payer: Medicare Other

## 2014-12-21 DIAGNOSIS — E785 Hyperlipidemia, unspecified: Secondary | ICD-10-CM | POA: Diagnosis not present

## 2014-12-21 DIAGNOSIS — I10 Essential (primary) hypertension: Secondary | ICD-10-CM

## 2014-12-21 LAB — COMPREHENSIVE METABOLIC PANEL
ALK PHOS: 88 U/L (ref 39–117)
ALT: 21 U/L (ref 0–35)
AST: 19 U/L (ref 0–37)
Albumin: 4 g/dL (ref 3.5–5.2)
BILIRUBIN TOTAL: 0.3 mg/dL (ref 0.2–1.2)
BUN: 13 mg/dL (ref 6–23)
CO2: 29 mEq/L (ref 19–32)
CREATININE: 0.63 mg/dL (ref 0.40–1.20)
Calcium: 9.6 mg/dL (ref 8.4–10.5)
Chloride: 102 mEq/L (ref 96–112)
GFR: 96.53 mL/min (ref >60.00)
GLUCOSE: 101 mg/dL — AB (ref 70–99)
Potassium: 4.2 mEq/L (ref 3.5–5.1)
Sodium: 140 mEq/L (ref 135–145)
TOTAL PROTEIN: 7.1 g/dL (ref 6.0–8.3)

## 2014-12-21 LAB — LDL CHOLESTEROL, DIRECT: Direct LDL: 126 mg/dL

## 2014-12-21 LAB — LIPID PANEL
Cholesterol: 222 mg/dL — ABNORMAL HIGH (ref 0–200)
HDL: 78.1 mg/dL (ref >39.00)
LDL Cholesterol: 119 mg/dL — ABNORMAL HIGH (ref 0–99)
NONHDL: 143.94
Total CHOL/HDL Ratio: 3
Triglycerides: 126 mg/dL (ref 0.0–149.0)
VLDL: 25.2 mg/dL (ref 0.0–40.0)

## 2014-12-21 NOTE — Telephone Encounter (Signed)
Labs and dx?  

## 2014-12-23 ENCOUNTER — Encounter: Payer: Self-pay | Admitting: Internal Medicine

## 2014-12-24 ENCOUNTER — Ambulatory Visit: Payer: Medicare Other | Admitting: Internal Medicine

## 2014-12-24 DIAGNOSIS — Z23 Encounter for immunization: Secondary | ICD-10-CM | POA: Diagnosis not present

## 2014-12-28 ENCOUNTER — Ambulatory Visit (INDEPENDENT_AMBULATORY_CARE_PROVIDER_SITE_OTHER): Payer: Medicare Other | Admitting: Internal Medicine

## 2014-12-28 ENCOUNTER — Encounter: Payer: Self-pay | Admitting: Internal Medicine

## 2014-12-28 VITALS — BP 130/74 | HR 75 | Temp 98.1°F | Resp 12 | Ht 63.0 in | Wt 191.5 lb

## 2014-12-28 DIAGNOSIS — Z23 Encounter for immunization: Secondary | ICD-10-CM

## 2014-12-28 DIAGNOSIS — Z0000 Encounter for general adult medical examination without abnormal findings: Secondary | ICD-10-CM | POA: Diagnosis not present

## 2014-12-28 DIAGNOSIS — Z853 Personal history of malignant neoplasm of breast: Secondary | ICD-10-CM

## 2014-12-28 DIAGNOSIS — R5383 Other fatigue: Secondary | ICD-10-CM | POA: Diagnosis not present

## 2014-12-28 DIAGNOSIS — K921 Melena: Secondary | ICD-10-CM

## 2014-12-28 DIAGNOSIS — Z1159 Encounter for screening for other viral diseases: Secondary | ICD-10-CM

## 2014-12-28 NOTE — Progress Notes (Signed)
Patient ID: Brianna Perry, female    DOB: 1932/11/23  Age: 81 y.o. MRN: [id number]  The patient is here for annual wellness exam and follow up on acute issuess.    [facility] ordered her last mammogrma which was done in June 6 yrs cancer free,  turning over to m e   FH of colon CA? No  The risk factors are reflected in the social history.  The roster of all physicians providing medical care to patient - is listed in the Snapshot section of the chart.  Activities of daily living:  The patient is 100% independent in all ADLs: dressing, toileting, feeding as well as independent mobility  Home safety : The patient has smoke detectors in the home. They wear seatbelts.  There are no firearms at home. There is no violence in the home.   There is no risks for hepatitis, STDs or HIV. There is no   history of blood transfusion. They have no travel history to infectious disease endemic areas of the world.  The patient has seen their dentist in the last six month. They have seen their eye doctor in the last year. They admit to slight hearing difficulty with regard to whispered voices and some television programs.  They have deferred audiologic testing in the last year.  They do not  have excessive sun exposure. Discussed the need for sun protection: hats, long sleeves and use of sunscreen if there is significant sun exposure.   Diet: the importance of a healthy diet is discussed. They do have a healthy diet.  The benefits of regular aerobic exercise were discussed. She walks 4 times per week ,  20 minutes.   Depression screen: there are no signs or vegative symptoms of depression- irritability, change in appetite, anhedonia, sadness/tearfullness.  Cognitive assessment: the patient manages all their financial and personal affairs and is actively engaged. They could relate day,date,year and events; recalled 2/3 objects at 3 minutes; performed clock-face test normally.  The following portions of the  patient's history were reviewed and updated as appropriate: allergies, current medications, past family history, past medical history,  past surgical history, past social history  and problem list.  Visual acuity was not assessed per patient preference since she has regular follow up with her ophthalmologist. Hearing and body mass index were assessed and reviewed.   During the course of the visit the patient was educated and counseled about appropriate screening and preventive services including : fall prevention , diabetes screening, nutrition counseling, colorectal cancer screening, and recommended immunizations.    CC: The primary encounter diagnosis was Hematochezia. Diagnoses of Need for hepatitis C screening test, Other fatigue, Need for prophylactic vaccination against Streptococcus pneumoniae (pneumococcus), Personal history of breast cancer, and Medicare annual wellness visit, subsequent were also pertinent to this visit.  She has noticed some bleeding during bowel movements. .  The blood has been mixed in with stools. Last colonoscopy at age 11? Not sure , It was done in [facility] ,  No records   History [place] has a past medical history of Arthritis; Cancer ([place]) (2010); Chickenpox; GERD (gastroesophageal reflux disease); Urine incontinence; Breast mass (x couple months); and Breast cancer ([place]) (2010).   She has past surgical history that includes Breast surgery (Right, 2010); Abdominal hysterectomy (1968); Tonsillectomy (1941); Breast cyst aspiration (Left, 2015); and Augmentation mammaplasty (Bilateral, 1973).   Her family history includes Arthritis in her mother; Breast cancer in her paternal aunt; Heart disease in her father and mother;  Hypertension in her mother.She reports that she has never smoked. She has never used smokeless tobacco. She reports that she does not drink alcohol or use illicit drugs.  Outpatient Prescriptions Prior to Visit  Medication Sig Dispense Refill  .  aspirin 81 MG tablet Take 81 mg by mouth daily.    . calcium carbonate (OS-CAL) 600 MG TABS tablet Take 1,200 mg by mouth daily.    . Multiple Vitamin (MULTIVITAMIN) capsule Take 1 capsule by mouth daily.    . Multiple Vitamins-Minerals (RA VISION-VITE PRESERVE PO) Take 1 tablet by mouth daily.    . Omega-3 Fatty Acids (FISH OIL) 300 MG CAPS Take 1 capsule by mouth daily.    [name] omeprazole (PRILOSEC) 20 MG capsule TAKE ONE CAPSULE BY MOUTH IN THE MORNING 90 capsule 1  . triazolam (HALCION) 0.25 MG tablet TAKE 1 TABLET BY MOUTH EVERY DAY AT BEDTIME AS NEEDED 30 tablet 5  . ciprofloxacin (CIPRO) 250 MG tablet Take 1 tablet (250 mg total) by mouth 2 (two) times daily. 10 tablet 0  . phenazopyridine (PYRIDIUM) 200 MG tablet Take 1 tablet (200 mg total) by mouth 3 (three) times daily as needed for pain. 10 tablet 0   No facility-administered medications prior to visit.    Review of Systems  Objective:  BP 130/74 mmHg  Pulse 75  Temp(Src) 98.1 F (36.7 C) (Oral)  Resp 12  Ht 5\' 3"  (1.6 m)  Wt 191 lb 8 oz (86.864 kg)  BMI 33.93 kg/m2  SpO2 98%  Physical Exam   General appearance: alert, cooperative and appears stated age Head: Normocephalic, without obvious abnormality, atraumatic Eyes: conjunctivae/corneas clear. PERRL, EOM's intact. Fundi benign. Ears: normal TM's and external ear canals both ears Nose: Nares normal. Septum midline. Mucosa normal. No drainage or sinus tenderness. Throat: lips, mucosa, and tongue normal; teeth and gums normal Neck: no adenopathy, no carotid bruit, no JVD, supple, symmetrical, trachea midline and thyroid not enlarged, symmetric, no tenderness/mass/nodules Lungs: clear to auscultation bilaterally Breasts: right lumpectomy scar upper outer quadrant.  Saline implant firm, right > left.  no masses or tenderness Heart: regular rate and rhythm, S1, S2 normal, no murmur, click, rub or gallop Abdomen: soft, non-tender; bowel sounds normal; no masses,  no  organomegaly Extremities: extremities normal, atraumatic, no cyanosis or edema Pulses: 2+ and symmetric Skin: Skin color, texture, turgor normal. No rashes or lesions Neurologic: Alert and oriented X 3, normal strength and tone. Normal symmetric reflexes. Normal coordination and gait.     Assessment & Plan:   Problem List Items Addressed This Visit    Personal history of breast cancer    Right breast,   diagnosed in 2010.  She is S/p lumpectomy and XRT in [place].  She has been taking tamoxifen since June 2010.   Annual mammogram ordered       Medicare annual wellness visit, subsequent    Annual Medicare wellness  exam was done as well as a comprehensive physical exam and management of acute and chronic conditions .  During the course of the visit the patient was educated and counseled about appropriate screening and preventive services including : fall prevention , diabetes screening, nutrition counseling, colorectal cancer screening, and recommended immunizations.  Printed recommendations for health maintenance screenings was given.       Hematochezia - Primary    Referral to Dr Bary Castilla for colonoscopy      Relevant Orders   Ambulatory referral to General Surgery   CBC with Differential/Platelet (Completed)  Iron and TIBC (Completed)   Ferritin (Completed)    Other Visit Diagnoses    Need for hepatitis C screening test        Relevant Orders    Hepatitis C antibody (Completed)    Other fatigue        Relevant Orders    TSH (Completed)    Need for prophylactic vaccination against Streptococcus pneumoniae (pneumococcus)        Relevant Orders    Pneumococcal polysaccharide vaccine 23-valent greater than or equal to 2yo subcutaneous/IM (Completed)       I have discontinued Ms. Mcmichael's ciprofloxacin and phenazopyridine. I am also having her maintain her multivitamin, FISH OIL, calcium carbonate, aspirin, Multiple Vitamins-Minerals (RA VISION-VITE PRESERVE PO), triazolam,  omeprazole, and TURMERIC PO.  Meds ordered this encounter  Medications  . TURMERIC PO    Sig: Take 1 capsule by mouth daily.    Medications Discontinued During This Encounter  Medication Reason  . ciprofloxacin (CIPRO) 250 MG tablet Completed Course  . phenazopyridine (PYRIDIUM) 200 MG tablet Completed Course    Follow-up: Return in about 1 year (around 12/28/2015).   Crecencio Mc, MD

## 2014-12-28 NOTE — Patient Instructions (Signed)

## 2014-12-28 NOTE — Progress Notes (Signed)
Pre-visit discussion using our clinic review tool. No additional management support is needed unless otherwise documented below in the visit note.  

## 2014-12-29 ENCOUNTER — Encounter: Payer: Self-pay | Admitting: *Deleted

## 2014-12-29 DIAGNOSIS — K921 Melena: Secondary | ICD-10-CM | POA: Insufficient documentation

## 2014-12-29 LAB — IRON AND TIBC
%SAT: 15 % (ref 11–50)
Iron: 53 ug/dL (ref 45–160)
TIBC: 356 ug/dL (ref 250–450)
UIBC: 303 ug/dL (ref 125–400)

## 2014-12-29 LAB — CBC WITH DIFFERENTIAL/PLATELET
Basophils Absolute: 0 10*3/uL (ref 0.0–0.1)
Basophils Relative: 0.3 % (ref 0.0–3.0)
EOS PCT: 1.6 % (ref 0.0–5.0)
Eosinophils Absolute: 0.1 10*3/uL (ref 0.0–0.7)
HCT: 38.3 % (ref 36.0–46.0)
Hemoglobin: 12.5 g/dL (ref 12.0–15.0)
Lymphocytes Relative: 26.6 % (ref 12.0–46.0)
Lymphs Abs: 2.1 10*3/uL (ref 0.7–4.0)
MCHC: 32.7 g/dL (ref 30.0–36.0)
MCV: 85.1 fl (ref 78.0–100.0)
MONOS PCT: 9.6 % (ref 3.0–12.0)
Monocytes Absolute: 0.7 10*3/uL (ref 0.1–1.0)
Neutro Abs: 4.8 10*3/uL (ref 1.4–7.7)
Neutrophils Relative %: 61.9 % (ref 43.0–77.0)
Platelets: 297 10*3/uL (ref 150.0–400.0)
RBC: 4.5 Mil/uL (ref 3.87–5.11)
RDW: 15.2 % (ref 11.5–15.5)
WBC: 7.7 10*3/uL (ref 4.0–10.5)

## 2014-12-29 LAB — TSH: TSH: 2.76 u[IU]/mL (ref 0.35–4.50)

## 2014-12-29 LAB — FERRITIN: Ferritin: 10.3 ng/mL (ref 10.0–291.0)

## 2014-12-29 LAB — HEPATITIS C ANTIBODY: HCV Ab: NEGATIVE

## 2014-12-29 NOTE — Assessment & Plan Note (Signed)
Right breastm  diagnosed in 2010.  She is S/p lumpectomy and XRT in [place].  She has been taking tamoxifen since June 2010.   Annual mammogram ordered

## 2014-12-29 NOTE — Assessment & Plan Note (Addendum)
  Lab Results  Component Value Date   IRON 53 12/28/2014   TIBC 356 12/28/2014   FERRITIN 10.3 12/28/2014   Lab Results  Component Value Date   WBC 7.7 12/28/2014   HGB 12.5 12/28/2014   HCT 38.3 12/28/2014   MCV 85.1 12/28/2014   PLT 297.0 12/28/2014     Referral to Dr Bary Castilla for colonoscopy

## 2014-12-29 NOTE — Assessment & Plan Note (Signed)

## 2014-12-30 ENCOUNTER — Encounter: Payer: Self-pay | Admitting: Internal Medicine

## 2015-01-01 ENCOUNTER — Other Ambulatory Visit: Payer: Self-pay | Admitting: Internal Medicine

## 2015-01-04 NOTE — Telephone Encounter (Signed)
Refill on [facility]

## 2015-01-05 NOTE — Telephone Encounter (Signed)
Ok to refill,  printed rx  

## 2015-01-06 ENCOUNTER — Ambulatory Visit (INDEPENDENT_AMBULATORY_CARE_PROVIDER_SITE_OTHER): Payer: Medicare Other

## 2015-01-06 DIAGNOSIS — H6123 Impacted cerumen, bilateral: Secondary | ICD-10-CM | POA: Diagnosis not present

## 2015-01-06 DIAGNOSIS — H918X3 Other specified hearing loss, bilateral: Secondary | ICD-10-CM | POA: Diagnosis not present

## 2015-01-06 NOTE — Progress Notes (Signed)
Patient saw Dr. Derrel Nip on 10/31, had both ears impacted with wax.  Dr. Derrel Nip prescribed some drops pre irrigation for both ears.  Patient has been doing them daily.  Stated that she was having trouble hearing on the left side more then the right.  Visualized right ear, small amount of wax noted in upper canal region, irrigated with peroxide and warm water.  Small amount of thin wax removed, visualized ear drum, no wax noted post irrigation.  Visualized left ear.  Copious amounts of orange thick wax noted.  Irrigated with peroxide and warm water.  Large amounts of orange dark thick wax removed.  Patient stated it was great to ear out of that ear again.  Patient tolerated irrigation and advised to call the office with further concerns or questions.

## 2015-01-06 NOTE — Progress Notes (Signed)
  I have reviewed the above information and agree with above.   Quentina Fronek, MD 

## 2015-01-07 NOTE — Telephone Encounter (Signed)
Mailed unread message to patient.  

## 2015-01-08 ENCOUNTER — Encounter: Payer: Self-pay | Admitting: Internal Medicine

## 2015-01-28 ENCOUNTER — Ambulatory Visit: Payer: Medicare Other

## 2015-02-02 ENCOUNTER — Encounter: Payer: Self-pay | Admitting: General Surgery

## 2015-02-02 ENCOUNTER — Ambulatory Visit (INDEPENDENT_AMBULATORY_CARE_PROVIDER_SITE_OTHER): Payer: Medicare Other | Admitting: General Surgery

## 2015-02-02 VITALS — BP 136/76 | HR 70 | Resp 12 | Ht 63.5 in | Wt 189.0 lb

## 2015-02-02 DIAGNOSIS — K625 Hemorrhage of anus and rectum: Secondary | ICD-10-CM | POA: Diagnosis not present

## 2015-02-02 LAB — POC HEMOCCULT BLD/STL (OFFICE/1-CARD/DIAGNOSTIC): Fecal Occult Blood, POC: NEGATIVE

## 2015-02-02 MED ORDER — POLYETHYLENE GLYCOL 3350 17 GM/SCOOP PO POWD: 1.0000 | Freq: Once | ORAL | Status: DC

## 2015-02-02 NOTE — Patient Instructions (Addendum)
Colonoscopy A colonoscopy is an exam to look at the entire large intestine (colon). This exam can help find problems such as tumors, polyps, inflammation, and areas of bleeding. The exam takes about 1 hour.  LET [facility] CARE PROVIDER KNOW ABOUT:   Any allergies you have.  All medicines you are taking, including vitamins, herbs, eye drops, creams, and over-the-counter medicines.  Previous problems you or members of your family have had with the use of anesthetics.  Any blood disorders you have.  Previous surgeries you have had.  Medical conditions you have. RISKS AND COMPLICATIONS  Generally, this is a safe procedure. However, as with any procedure, complications can occur. Possible complications include:  Bleeding.  Tearing or rupture of the colon wall.  Reaction to medicines given during the exam.  Infection (rare). BEFORE THE PROCEDURE   Ask your health care provider about changing or stopping your regular medicines.  You may be prescribed an oral bowel prep. This involves drinking a large amount of medicated liquid, starting the day before your procedure. The liquid will cause you to have multiple loose stools until your stool is almost clear or light green. This cleans out your colon in preparation for the procedure.  Do not eat or drink anything else once you have started the bowel prep, unless your health care provider tells you it is safe to do so.  Arrange for someone to drive you home after the procedure. PROCEDURE   You will be given medicine to help you relax (sedative).  You will lie on your side with your knees bent.  A long, flexible tube with a light and camera on the end (colonoscope) will be inserted through the rectum and into the colon. The camera sends video back to a computer screen as it moves through the colon. The colonoscope also releases carbon dioxide gas to inflate the colon. This helps your health care provider see the area better.  During  the exam, your health care provider may take a small tissue sample (biopsy) to be examined under a microscope if any abnormalities are found.  The exam is finished when the entire colon has been viewed. AFTER THE PROCEDURE   Do not drive for 24 hours after the exam.  You may have a small amount of blood in your stool.  You may pass moderate amounts of gas and have mild abdominal cramping or bloating. This is caused by the gas used to inflate your colon during the exam.  Ask when your test results will be ready and how you will get your results. Make sure you get your test results.   This information is not intended to replace advice given to you by your health care provider. Make sure you discuss any questions you have with your health care provider.   Document Released: 02/11/2000 Document Revised: 12/04/2012 Document Reviewed: 10/21/2012 Elsevier Interactive Patient Education [employer].  Patient is scheduled for a colonoscopy at [facility] on [facility]. She will stop her Fish Oil one week prior. Miralax prescription has been sent into her pharmacy. Patient is aware of date and instructions.

## 2015-02-02 NOTE — Progress Notes (Signed)
Patient ID: Brianna Perry, female   DOB: 11-25-1932, 81 y.o.   MRN: [id number]  Chief Complaint  Patient presents with  . Rectal Bleeding    HPI Brianna Perry is a 81 y.o. female.  Here today for evaluation of blood in her stools. She states it happened back in July and it lasted off and on for 3 weeks. She has not seen any since that episode. She states the bright red blood looked "like ribbon on the stool". Denies any abdominal pain. Her last colonoscopy was at last over 10 years ago in [place]. She states her bowels are regular and daily. She states that maybe at least once a month she has one day of diarrhea(watery) then her stools become normal again. No bleeding noted when she has the diarrhea. Denies family history of colon cancer.  I personally reviewed the patient's history. HPI  Past Medical History  Diagnosis Date  . Arthritis   . Cancer [facility]) 2010    Right breast  . Chickenpox   . GERD (gastroesophageal reflux disease)   . Urine incontinence   . Breast mass x couple months    Right Breast  . Breast cancer ([place]) 2010    Right Breast c lumpectomy c radiation    Past Surgical History  Procedure Laterality Date  . Breast surgery Right 2010  . Abdominal hysterectomy  1968  . Tonsillectomy  1941  . Breast cyst aspiration Left 2015    Negative  . Augmentation mammaplasty Bilateral 1973  . Colonoscopy      [facility]    Family History  Problem Relation Age of Onset  . Arthritis Mother   . Heart disease Mother   . Hypertension Mother   . Heart disease Father   . Breast cancer Paternal Aunt     30s    Social History Social History  Substance Use Topics  . Smoking status: Never Smoker   . Smokeless tobacco: Never Used  . Alcohol Use: No    Allergies  Allergen Reactions  . Penicillins Swelling    Current Outpatient Prescriptions  Medication Sig Dispense Refill  . aspirin 81 MG tablet Take 81 mg by mouth daily.    . calcium carbonate (OS-CAL)  600 MG TABS tablet Take 1,200 mg by mouth daily.    . Multiple Vitamin (MULTIVITAMIN) capsule Take 1 capsule by mouth daily.    . Multiple Vitamins-Minerals (RA VISION-VITE PRESERVE PO) Take 1 tablet by mouth daily.    . Omega-3 Fatty Acids (FISH OIL) 300 MG CAPS Take 1 capsule by mouth daily.    [name] omeprazole (PRILOSEC) 20 MG capsule TAKE ONE CAPSULE BY MOUTH IN THE MORNING 90 capsule 1  . triazolam (HALCION) 0.25 MG tablet TAKE 1 TABLET BY MOUTH AT BEDTIME 30 tablet 5  . TURMERIC PO Take 1 capsule by mouth daily.    . polyethylene glycol powder (GLYCOLAX/MIRALAX) powder Take 255 g by mouth once. 255 g 0   No current facility-administered medications for this visit.    Review of Systems Review of Systems  Constitutional: Negative.   Respiratory: Negative.   Cardiovascular: Negative.   Gastrointestinal: Positive for diarrhea. Negative for nausea, vomiting, abdominal pain, constipation and rectal pain.    Blood pressure 136/76, pulse 70, resp. rate 12, height 5' 3.5" (1.613 m), weight 189 lb (85.73 kg).  Physical Exam Physical Exam  Constitutional: She is oriented to person, place, and time. She appears well-developed and well-nourished.  HENT:  Mouth/Throat: Oropharynx  is clear and moist.  Eyes: Conjunctivae are normal. No scleral icterus.  Neck: Neck supple.  Cardiovascular: Normal rate, regular rhythm and normal heart sounds.   Pulmonary/Chest: Effort normal and breath sounds normal.  Abdominal: Soft. Normal appearance and bowel sounds are normal. There is no tenderness.  Genitourinary: Guaiac negative stool.  Lymphadenopathy:    She has no cervical adenopathy.  Neurological: She is alert and oriented to person, place, and time.  Skin: Skin is warm and dry.  Psychiatric: Her behavior is normal.    Data Reviewed 12/28/2014 CBC was normal.  Assessment    Rectal bleeding, no clear etiology.    Plan    In spite of her age, her performance status is excellent and she's  been encouraged to have a colonoscopy to assess if any source for her bleeding outside of internal hemorrhoids is identified.    Colonoscopy with possible biopsy/polypectomy prn: Information regarding the procedure, including its potential risks and complications (including but not limited to perforation of the bowel, which may require emergency surgery to repair, and bleeding) was verbally given to the patient. Educational information regarding lower intestinal endoscopy was given to the patient. Written instructions for how to complete the bowel prep using Miralax were provided. The importance of drinking ample fluids to avoid dehydration as a result of the prep emphasized.  Patient is scheduled for a colonoscopy at [facility] on 03/23/15. She will stop her Fish Oil one week prior. Miralax prescription has been sent into her pharmacy. Patient is aware of date and instructions.   PCP:  Mattie Marlin 02/03/2015, 5:45 PM

## 2015-02-03 DIAGNOSIS — K625 Hemorrhage of anus and rectum: Secondary | ICD-10-CM | POA: Insufficient documentation

## 2015-02-27 ENCOUNTER — Encounter: Payer: Self-pay | Admitting: Internal Medicine

## 2015-03-02 ENCOUNTER — Telehealth: Payer: Self-pay | Admitting: Internal Medicine

## 2015-03-02 MED ORDER — ALPRAZOLAM 0.5 MG PO TABS: 0.5000 mg | ORAL_TABLET | Freq: Every evening | ORAL | Status: DC | PRN

## 2015-03-02 NOTE — Telephone Encounter (Signed)
  Please  start a prior authorization for Triazolam. patient uses it for insomnia.   sendng in alprazolam in the meantime.

## 2015-03-03 ENCOUNTER — Encounter: Payer: Self-pay | Admitting: Internal Medicine

## 2015-03-03 NOTE — Telephone Encounter (Signed)
Pa sent o plan on cover my meds awaiting decision of insurance.

## 2015-03-03 NOTE — Telephone Encounter (Signed)
Given by phone call to patient.

## 2015-03-04 ENCOUNTER — Telehealth: Payer: Self-pay | Admitting: *Deleted

## 2015-03-04 MED ORDER — DOXEPIN HCL 3 MG PO TABS: 1.0000 | ORAL_TABLET | Freq: Every evening | ORAL | Status: DC | PRN

## 2015-03-04 NOTE — Telephone Encounter (Signed)
Raquel Sarna from

## 2015-03-04 NOTE — Telephone Encounter (Signed)
[employer] has requested a medication change from triazolam, [employer] will no longer cover this medication, the suggested alternate medication can be silenor.

## 2015-03-04 NOTE — Telephone Encounter (Signed)
Called and changed to alprazolam as directed

## 2015-03-04 NOTE — Telephone Encounter (Signed)
notify patient I will send the alternative to pharmacy .  Start with 1 tablet 30 minutes prior to bedtime.  May increase to 6 mg after 3-4 days if not helping.

## 2015-03-04 NOTE — Telephone Encounter (Signed)
Raquel Sarna from [facility] called to get medication clarification on a Rx sent over,

## 2015-03-04 NOTE — Telephone Encounter (Signed)
Raquel Sarna from [facility] requested a call back for clarity of strenth of medication.  [phone]

## 2015-03-04 NOTE — Telephone Encounter (Signed)
Spoke with the patient, she has changed pharmacies, changed and resent to [facility].  Thanks

## 2015-03-04 NOTE — Telephone Encounter (Signed)
Please advise, this was the determination from a PA for the the triazolam.  Patient had been on the triazolam since 06/18/2013.  Thanks

## 2015-03-08 ENCOUNTER — Encounter: Payer: Self-pay | Admitting: Internal Medicine

## 2015-03-17 ENCOUNTER — Telehealth: Payer: Self-pay | Admitting: *Deleted

## 2015-03-17 NOTE — Telephone Encounter (Signed)
Message for patient to call the office.   Patient is scheduled for a colonoscopy with Dr. Bary Castilla on 03-23-15 at [facility]. We need to confirm she has had no medication changes since last office visit. Also, need to confirm patient has Miralax prescription.

## 2015-03-17 NOTE — Telephone Encounter (Signed)
Patient called back and confirms no medication changes since last office visit. She also states she has picked up Miralax prescription.  We will proceed with colonoscopy as scheduled for next week.   This patient was instructed to call the office should she have further questions.

## 2015-03-23 ENCOUNTER — Encounter
Admission: RE | Disposition: A | Discharge status: Home / Self Care | Payer: Self-pay | Source: Ambulatory Visit | Attending: General Surgery

## 2015-03-23 ENCOUNTER — Encounter: Payer: Self-pay | Admitting: *Deleted

## 2015-03-23 ENCOUNTER — Ambulatory Visit: Payer: Medicare Other | Admitting: Anesthesiology

## 2015-03-23 ENCOUNTER — Ambulatory Visit
Admission: RE | Admit: 2015-03-23 | Discharge: 2015-03-23 | Disposition: A | Discharge status: Home / Self Care | Payer: Medicare Other | Source: Ambulatory Visit | Attending: General Surgery | Admitting: General Surgery

## 2015-03-23 DIAGNOSIS — Z880 Allergy status to penicillin: Secondary | ICD-10-CM | POA: Insufficient documentation

## 2015-03-23 DIAGNOSIS — Z79899 Other long term (current) drug therapy: Secondary | ICD-10-CM | POA: Insufficient documentation

## 2015-03-23 DIAGNOSIS — K625 Hemorrhage of anus and rectum: Secondary | ICD-10-CM | POA: Diagnosis not present

## 2015-03-23 DIAGNOSIS — R197 Diarrhea, unspecified: Secondary | ICD-10-CM | POA: Insufficient documentation

## 2015-03-23 DIAGNOSIS — M1991 Primary osteoarthritis, unspecified site: Secondary | ICD-10-CM | POA: Diagnosis not present

## 2015-03-23 DIAGNOSIS — K5790 Diverticulosis of intestine, part unspecified, without perforation or abscess without bleeding: Secondary | ICD-10-CM | POA: Diagnosis not present

## 2015-03-23 DIAGNOSIS — K648 Other hemorrhoids: Secondary | ICD-10-CM | POA: Insufficient documentation

## 2015-03-23 DIAGNOSIS — Z9889 Other specified postprocedural states: Secondary | ICD-10-CM | POA: Insufficient documentation

## 2015-03-23 DIAGNOSIS — D122 Benign neoplasm of ascending colon: Secondary | ICD-10-CM | POA: Diagnosis not present

## 2015-03-23 DIAGNOSIS — Z7982 Long term (current) use of aspirin: Secondary | ICD-10-CM | POA: Diagnosis not present

## 2015-03-23 DIAGNOSIS — K219 Gastro-esophageal reflux disease without esophagitis: Secondary | ICD-10-CM | POA: Diagnosis not present

## 2015-03-23 DIAGNOSIS — K635 Polyp of colon: Secondary | ICD-10-CM | POA: Diagnosis not present

## 2015-03-23 DIAGNOSIS — Z853 Personal history of malignant neoplasm of breast: Secondary | ICD-10-CM | POA: Diagnosis not present

## 2015-03-23 DIAGNOSIS — D120 Benign neoplasm of cecum: Secondary | ICD-10-CM | POA: Insufficient documentation

## 2015-03-23 DIAGNOSIS — Z90710 Acquired absence of both cervix and uterus: Secondary | ICD-10-CM | POA: Diagnosis not present

## 2015-03-23 HISTORY — PX: COLONOSCOPY WITH PROPOFOL: SHX5780

## 2015-03-23 SURGERY — COLONOSCOPY WITH PROPOFOL
Anesthesia: General

## 2015-03-23 MED ORDER — LIDOCAINE HCL (CARDIAC) 20 MG/ML IV SOLN
INTRAVENOUS | Status: DC | PRN
  Administered 2015-03-23: 60 mg via INTRAVENOUS

## 2015-03-23 MED ORDER — PROPOFOL 500 MG/50ML IV EMUL
INTRAVENOUS | Status: DC | PRN
  Administered 2015-03-23: 120 ug/kg/min via INTRAVENOUS
  Administered 2015-03-23: 140 ug/kg/min via INTRAVENOUS

## 2015-03-23 MED ORDER — PROPOFOL 10 MG/ML IV BOLUS
INTRAVENOUS | Status: DC | PRN
  Administered 2015-03-23: 20 mg via INTRAVENOUS
  Administered 2015-03-23: 60 mg via INTRAVENOUS

## 2015-03-23 MED ORDER — SODIUM CHLORIDE 0.9 % IV SOLN
INTRAVENOUS | Status: DC
  Administered 2015-03-23: 1000 mL via INTRAVENOUS

## 2015-03-23 NOTE — Op Note (Signed)
[facility] Patient Name: Brianna Perry Procedure Date: 03/23/2015 1:55 PM MRN: [id number] Account #: [id number] Date of Birth: 11/07/1932 Admit Type: Outpatient Age: 82 Room: [facility] ENDO ROOM 3 Gender: Female Note Status: Finalized Procedure:         Colonoscopy Indications:       Clinically significant diarrhea of unexplained origin,                     Rectal bleeding Providers:         [name], MD Referring MD:      Deborra Medina, MD (Referring MD) Medicines:         Monitored Anesthesia Care Complications:     No immediate complications. Procedure:         Pre-Anesthesia Assessment:                    - Prior to the procedure, a History and Physical was                     performed, and patient medications, allergies and                     sensitivities were reviewed. The patient's tolerance of                     previous anesthesia was reviewed.                    - The risks and benefits of the procedure and the sedation                     options and risks were discussed with the patient. All                     questions were answered and informed consent was obtained.                    After obtaining informed consent, the colonoscope was                     passed under direct vision. Throughout the procedure, the                     patient's blood pressure, pulse, and oxygen saturations                     were monitored continuously. The Colonoscope was                     introduced through the anus and advanced to the the cecum,                     identified by the appendiceal orifice, IC valve and                     transillumination. The colonoscopy was performed without                     difficulty. The patient tolerated the procedure well. The                     quality of the bowel preparation was excellent. Findings:      Two sessile polyps were found in the ascending colon, in the proximal  ascending  colon and in the cecum. The polyps were 5 mm in size. These       were biopsied with a cold large-capacity forceps for histology.      Non-bleeding internal hemorrhoids were found during retroflexion. The       hemorrhoids were moderate.      Biopsy of the descending colon completed with her history of       intermittent diarrrhea. Impression:        - Two 5 mm polyps in the ascending colon, in the proximal                     ascending colon and in the cecum. Biopsied.                    - Non-bleeding internal hemorrhoids. Recommendation:    - Telephone endoscopist for pathology results in 1 week. Procedure Code(s): --- Professional ---                    [phone], Colonoscopy, flexible; with biopsy, single or                     multiple Diagnosis Code(s): --- Professional ---                    K62.5, Hemorrhage of anus and rectum                    R19.7, Diarrhea, unspecified                    K64.8, Other hemorrhoids                    D12.0, Benign neoplasm of cecum                    D12.2, Benign neoplasm of ascending colon CPT copyright 2014 American Medical Association. All rights reserved. The codes documented in this report are preliminary and upon coder review may  be revised to meet current compliance requirements. [name], MD 03/23/2015 2:31:22 PM This report has been signed electronically. Number of Addenda: 0 Note Initiated On: 03/23/2015 1:55 PM Scope Withdrawal Time: 0 hours 15 minutes 6 seconds  Total Procedure Duration: 0 hours 24 minutes 39 seconds       [facility]

## 2015-03-23 NOTE — Anesthesia Preprocedure Evaluation (Signed)
Anesthesia Evaluation  Patient identified by MRN, date of birth, ID band Patient awake    Reviewed: Allergy & Precautions, NPO status , Patient's Chart, lab work & pertinent test results, reviewed documented beta blocker date and time   Airway Mallampati: II  TM Distance: >3 FB     Dental  (+) Chipped   Pulmonary           Cardiovascular      Neuro/Psych    GI/Hepatic GERD  ,  Endo/Other    Renal/GU      Musculoskeletal  (+) Arthritis ,   Abdominal   Peds  Hematology   Anesthesia Other Findings Hx of br cancer.  Reproductive/Obstetrics                             Anesthesia Physical Anesthesia Plan  ASA: III  Anesthesia Plan: General   Post-op Pain Management:    Induction: Intravenous  Airway Management Planned: Nasal Cannula  Additional Equipment:   Intra-op Plan:   Post-operative Plan:   Informed Consent: I have reviewed the patients History and Physical, chart, labs and discussed the procedure including the risks, benefits and alternatives for the proposed anesthesia with the patient or authorized representative who has indicated his/her understanding and acceptance.     Plan Discussed with: CRNA  Anesthesia Plan Comments:         Anesthesia Quick Evaluation

## 2015-03-23 NOTE — H&P (Signed)
Brianna Perry [id number] 1933/01/13     HPI: Episode of BRBPR in December, none since. Hx intermittent diarrhea without necessarily any abdominal pain occuring about once a month for the past year. Presently, asymptomatic.   Prescriptions prior to admission  Medication Sig Dispense Refill Last Dose  . ALPRAZolam (XANAX) 0.5 MG tablet Take 1 tablet (0.5 mg total) by mouth at bedtime as needed for sleep. 30 tablet 3 03/22/2015 at Unknown time  . aspirin 81 MG tablet Take 81 mg by mouth daily.   03/22/2015 at Unknown time  . calcium carbonate (OS-CAL) 600 MG TABS tablet Take 1,200 mg by mouth daily.   03/22/2015 at Unknown time  . Doxepin HCl 3 MG TABS Take 1 tablet (3 mg total) by mouth at bedtime as needed (insomnia). 30 tablet 3 03/22/2015 at Unknown time  . Multiple Vitamin (MULTIVITAMIN) capsule Take 1 capsule by mouth daily.   03/22/2015 at Unknown time  . Multiple Vitamins-Minerals (RA VISION-VITE PRESERVE PO) Take 1 tablet by mouth daily.   03/22/2015 at Unknown time  . Omega-3 Fatty Acids (FISH OIL) 300 MG CAPS Take 1 capsule by mouth daily.   03/22/2015 at Unknown time  . omeprazole (PRILOSEC) 20 MG capsule TAKE ONE CAPSULE BY MOUTH IN THE MORNING 90 capsule 1 03/23/2015 at 0715  . polyethylene glycol powder (GLYCOLAX/MIRALAX) powder Take 255 g by mouth once. 255 g 0 03/22/2015 at Unknown time  . TURMERIC PO Take 1 capsule by mouth daily.   03/22/2015 at Unknown time  . triazolam (HALCION) 0.25 MG tablet TAKE 1 TABLET BY MOUTH AT BEDTIME (Patient not taking: Reported on 03/23/2015) 30 tablet 5 Not Taking at Unknown time   Allergies  Allergen Reactions  . Penicillins Swelling   Past Medical History  Diagnosis Date  . Arthritis   . Cancer [facility]) 2010    Right breast  . Chickenpox   . GERD (gastroesophageal reflux disease)   . Urine incontinence   . Breast mass x couple months    Right Breast  . Breast cancer ([place]) 2010    Right Breast c lumpectomy c radiation   Past Surgical History   Procedure Laterality Date  . Breast surgery Right 2010  . Abdominal hysterectomy  1968  . Tonsillectomy  1941  . Breast cyst aspiration Left 2015    Negative  . Augmentation mammaplasty Bilateral 1973  . Colonoscopy      [facility]  . Eye surgery    . Tonsillectomy     Social History   Social History  . Marital Status: Married    Spouse Name: N/A  . Number of Children: N/A  . Years of Education: N/A   Occupational History  . Not on file.   Social History Main Topics  . Smoking status: Never Smoker   . Smokeless tobacco: Never Used  . Alcohol Use: No  . Drug Use: No  . Sexual Activity: Yes   Other Topics Concern  . Not on file   Social History Narrative   Social History   Social History Narrative     ROS: Negative.     PE: HEENT: Negative. Lungs: Clear. Cardio: RR. Brianna Perry 03/23/2015   Assessment/Plan:  Proceed with planned endoscopy.

## 2015-03-23 NOTE — Transfer of Care (Signed)
Immediate Anesthesia Transfer of Care Note  Patient: Brianna Perry  Procedure(s) Performed: Procedure(s): COLONOSCOPY WITH PROPOFOL (N/A)  Patient Location: PACU  Anesthesia Type:General  Level of Consciousness: awake and patient cooperative  Airway & Oxygen Therapy: Patient Spontanous Breathing and Patient connected to nasal cannula oxygen  Post-op Assessment: Report given to RN  Post vital signs: Reviewed and stable  Last Vitals:  Filed Vitals:   03/23/15 1315 03/23/15 1434  BP: 157/75 105/62  Pulse: 81 77  Temp: 37.1 C 36.6 C  Resp: 18 20    Complications: No apparent anesthesia complications

## 2015-03-23 NOTE — Anesthesia Postprocedure Evaluation (Signed)
Anesthesia Post Note  Patient: Brianna Perry  Procedure(s) Performed: Procedure(s) (LRB): COLONOSCOPY WITH PROPOFOL (N/A)  Patient location during evaluation: Endoscopy Anesthesia Type: General Level of consciousness: awake Pain management: pain level controlled Vital Signs Assessment: post-procedure vital signs reviewed and stable Respiratory status: spontaneous breathing Cardiovascular status: blood pressure returned to baseline Anesthetic complications: no    Last Vitals:  Filed Vitals:   03/23/15 1454 03/23/15 1504  BP: 128/73 142/70  Pulse: 75 69  Temp:    Resp: 22 14    Last Pain: There were no vitals filed for this visit.               Emmilynn Marut S

## 2015-03-24 ENCOUNTER — Encounter: Payer: Self-pay | Admitting: General Surgery

## 2015-03-24 IMAGING — US US BREAST*L* LIMITED INC AXILLA
1 series · 6 of 6 positions shown · non-contrast
Comparison: Priors

CLINICAL DATA: Patient for follow-up with history of right breast
lumpectomy 9414.

EXAM:
DIGITAL DIAGNOSTIC  BILATERAL MAMMOGRAM WITH CAD
ULTRASOUND LEFT BREAST

[Series 1: us breast*left* limited inc axilla · 0.08mm/px · 6 of 6 slices shown]
[im 1/6]
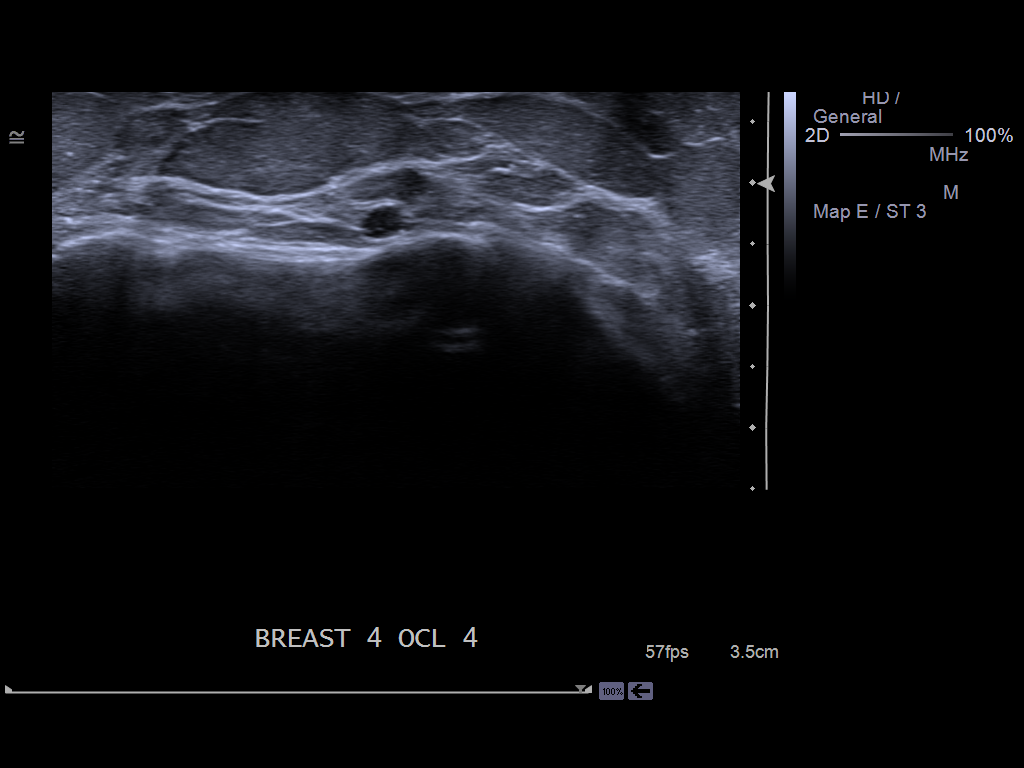
[im 2/6]
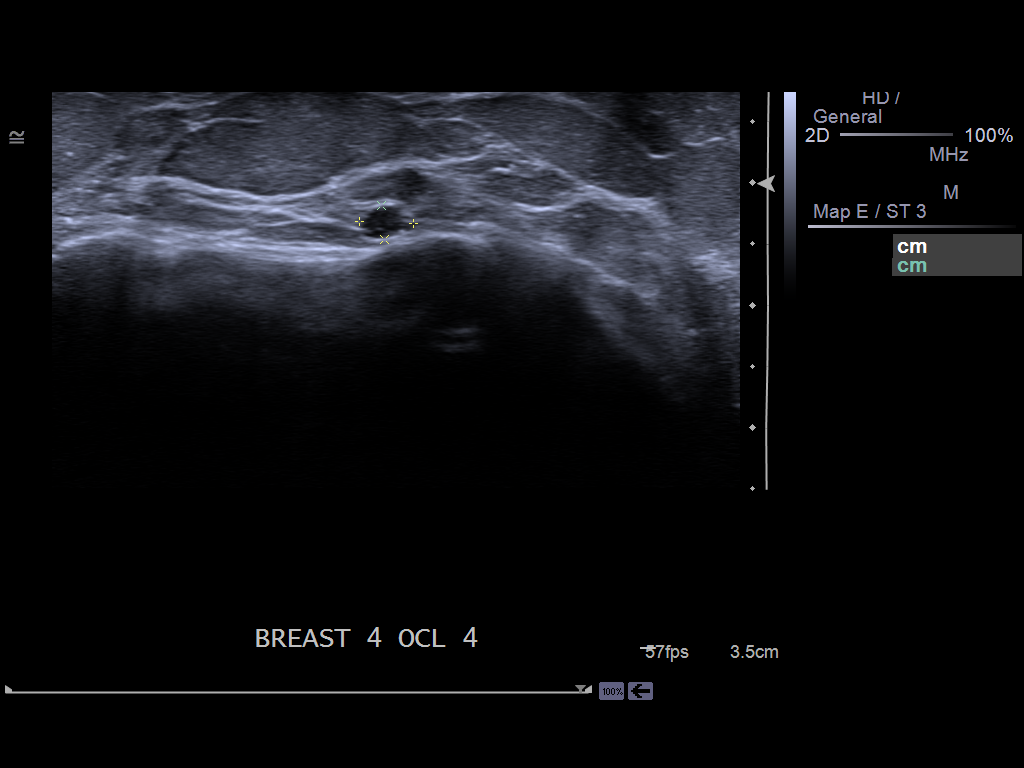
[im 3/6]
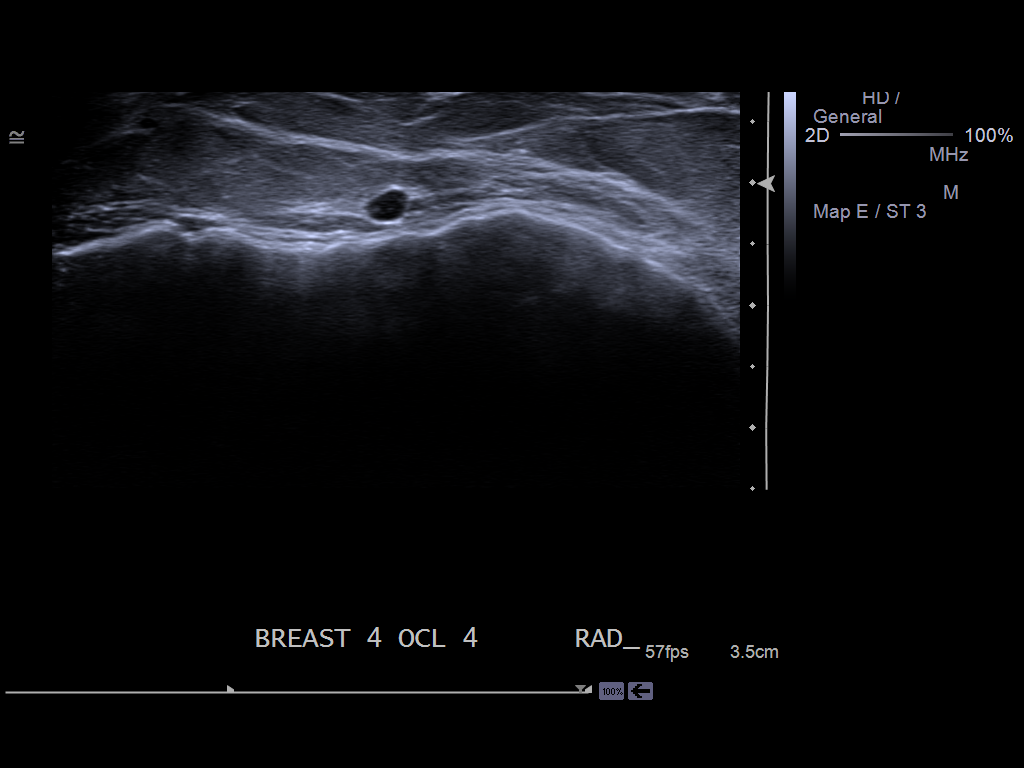
[im 4/6]
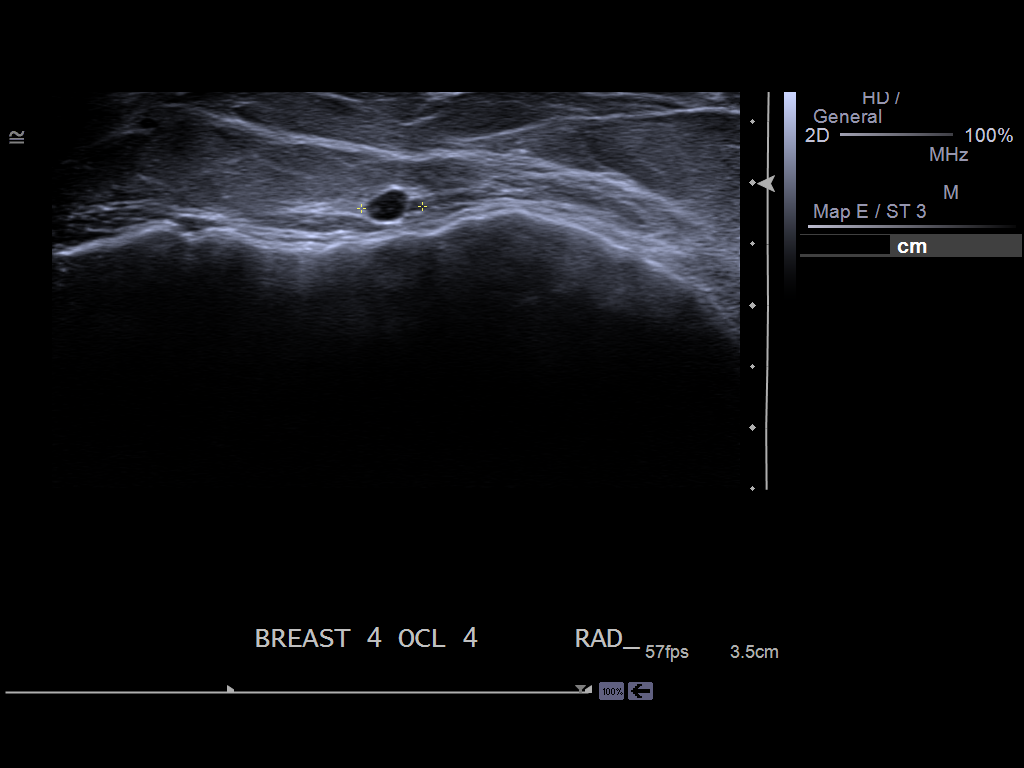
[im 5/6]
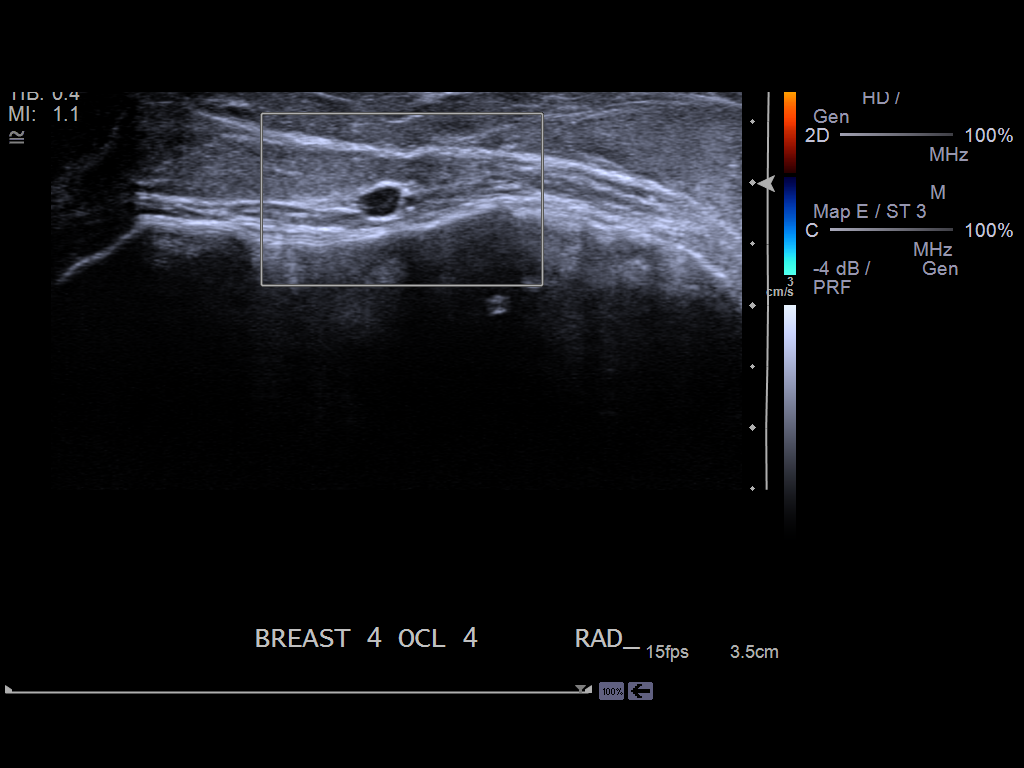
[im 6/6]
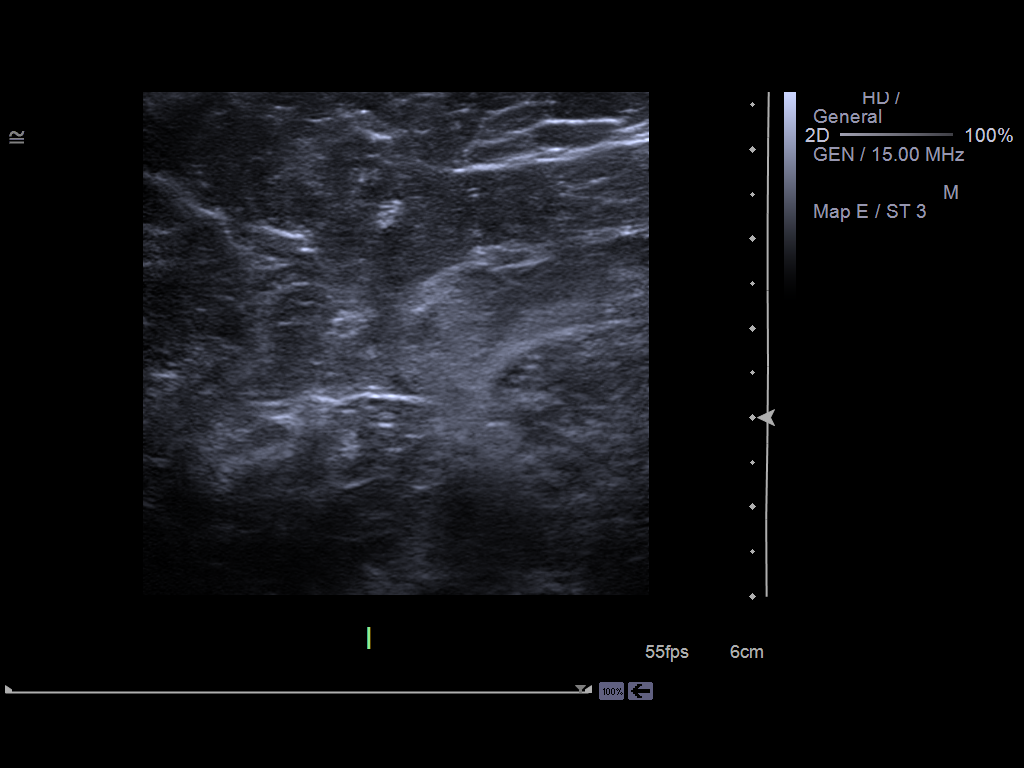

[6 of 6 positions shown; findings below may reference images not displayed]

ACR Breast Density Category b: There are scattered areas of
fibroglandular density.
FINDINGS: Stable postlumpectomy changes within the upper-outer quadrant of the
right breast. Within the lower outer quadrant of the left breast
posterior depth there is a rounded circumscribed mass, this will be
further evaluated with ultrasound.

Mammographic images were processed with CAD.

On physical exam, I palpate no discrete mass within the lower outer
quadrant of the left breast..

Ultrasound is performed, showing a 5 x 4 x 3 cm hypoechoic mass,
predominately circumscribed however has an angular margin along 1
aspect..
IMPRESSION: Suspicious left breast mass, possibly representing and enlarging
intramammary lymph node however carcinoma is not excluded. Given the
patient's personal history of breast cancer, an ultrasound-guided
core needle biopsy is recommended. This is scheduled for 03/17/2013
at 9 a.m..

RECOMMENDATION:
Ultrasound-guided core needle biopsy left breast mass.

I have discussed the findings and recommendations with the patient.
Results were also provided in writing at the conclusion of the
visit.

BI-RADS CATEGORY  4: Suspicious abnormality - biopsy should be
considered.

## 2015-03-25 LAB — SURGICAL PATHOLOGY

## 2015-03-26 ENCOUNTER — Telehealth: Payer: Self-pay

## 2015-03-26 NOTE — Telephone Encounter (Signed)
Notified patient as instructed, patient pleased. Discussed follow-up in 5 years, patient agrees.  

## 2015-03-26 NOTE — Telephone Encounter (Signed)
-----   Message from [name], MD sent at 03/26/2015  8:19 AM EST ----- Please notify the patient that the polyp removed at the time of her recent colonoscopy was benign. She might be considered a candidate for follow-up exam in 5 years based on her general health status.  No bleeding site identified, and the episode that occurred in summer 2016 was likely from hemorrhoids.  She should report any changes in her stools probably. Follow up otherwise here would be on an as-needed basis.  Thank you ----- Message -----    From: [facility]    Sent: 03/25/2015   4:46 PM      To: [name], MD

## 2015-04-26 ENCOUNTER — Encounter: Payer: Self-pay | Admitting: Internal Medicine

## 2015-04-26 ENCOUNTER — Other Ambulatory Visit: Payer: Self-pay | Admitting: Internal Medicine

## 2015-04-26 MED ORDER — OMEPRAZOLE 20 MG PO CPDR: 20.0000 mg | DELAYED_RELEASE_CAPSULE | Freq: Every morning | ORAL | Status: DC

## 2015-09-14 ENCOUNTER — Encounter: Payer: Self-pay | Admitting: Internal Medicine

## 2015-10-27 ENCOUNTER — Other Ambulatory Visit: Payer: Self-pay | Admitting: Internal Medicine

## 2015-12-23 DIAGNOSIS — Z23 Encounter for immunization: Secondary | ICD-10-CM | POA: Diagnosis not present

## 2015-12-29 ENCOUNTER — Ambulatory Visit (INDEPENDENT_AMBULATORY_CARE_PROVIDER_SITE_OTHER): Payer: Medicare Other | Admitting: Internal Medicine

## 2015-12-29 ENCOUNTER — Ambulatory Visit (INDEPENDENT_AMBULATORY_CARE_PROVIDER_SITE_OTHER): Payer: Medicare Other

## 2015-12-29 ENCOUNTER — Encounter: Payer: Self-pay | Admitting: Internal Medicine

## 2015-12-29 VITALS — BP 134/82 | HR 67 | Temp 98.0°F | Resp 12 | Ht 63.75 in | Wt 198.2 lb

## 2015-12-29 DIAGNOSIS — Z0000 Encounter for general adult medical examination without abnormal findings: Secondary | ICD-10-CM

## 2015-12-29 DIAGNOSIS — Z853 Personal history of malignant neoplasm of breast: Secondary | ICD-10-CM

## 2015-12-29 DIAGNOSIS — E559 Vitamin D deficiency, unspecified: Secondary | ICD-10-CM

## 2015-12-29 DIAGNOSIS — F5104 Psychophysiologic insomnia: Secondary | ICD-10-CM

## 2015-12-29 DIAGNOSIS — R059 Cough, unspecified: Secondary | ICD-10-CM

## 2015-12-29 DIAGNOSIS — R05 Cough: Secondary | ICD-10-CM | POA: Diagnosis not present

## 2015-12-29 DIAGNOSIS — R053 Chronic cough: Secondary | ICD-10-CM

## 2015-12-29 DIAGNOSIS — E6609 Other obesity due to excess calories: Secondary | ICD-10-CM

## 2015-12-29 DIAGNOSIS — Z6833 Body mass index (BMI) 33.0-33.9, adult: Secondary | ICD-10-CM

## 2015-12-29 DIAGNOSIS — R5383 Other fatigue: Secondary | ICD-10-CM

## 2015-12-29 DIAGNOSIS — K921 Melena: Secondary | ICD-10-CM

## 2015-12-29 LAB — CBC WITH DIFFERENTIAL/PLATELET
BASOS ABS: 0 10*3/uL (ref 0.0–0.1)
BASOS PCT: 0.7 % (ref 0.0–3.0)
EOS PCT: 1.7 % (ref 0.0–5.0)
Eosinophils Absolute: 0.1 10*3/uL (ref 0.0–0.7)
HEMATOCRIT: 39.9 % (ref 36.0–46.0)
Hemoglobin: 13.2 g/dL (ref 12.0–15.0)
LYMPHS ABS: 1.7 10*3/uL (ref 0.7–4.0)
LYMPHS PCT: 29.2 % (ref 12.0–46.0)
MCHC: 33 g/dL (ref 30.0–36.0)
MCV: 86.9 fl (ref 78.0–100.0)
MONOS PCT: 9.6 % (ref 3.0–12.0)
Monocytes Absolute: 0.6 10*3/uL (ref 0.1–1.0)
NEUTROS ABS: 3.5 10*3/uL (ref 1.4–7.7)
Neutrophils Relative %: 58.8 % (ref 43.0–77.0)
PLATELETS: 282 10*3/uL (ref 150.0–400.0)
RBC: 4.59 Mil/uL (ref 3.87–5.11)
RDW: 15.8 % — AB (ref 11.5–15.5)
WBC: 5.9 10*3/uL (ref 4.0–10.5)

## 2015-12-29 LAB — VITAMIN D 25 HYDROXY (VIT D DEFICIENCY, FRACTURES): VITD: 24.54 ng/mL — ABNORMAL LOW (ref 30.00–100.00)

## 2015-12-29 LAB — COMPREHENSIVE METABOLIC PANEL
ALT: 15 U/L (ref 0–35)
AST: 15 U/L (ref 0–37)
Albumin: 4 g/dL (ref 3.5–5.2)
Alkaline Phosphatase: 79 U/L (ref 39–117)
BUN: 17 mg/dL (ref 6–23)
CHLORIDE: 104 meq/L (ref 96–112)
CO2: 29 meq/L (ref 19–32)
CREATININE: 0.58 mg/dL (ref 0.40–1.20)
Calcium: 9.4 mg/dL (ref 8.4–10.5)
GFR: 105.92 mL/min (ref >60.00)
GLUCOSE: 100 mg/dL — AB (ref 70–99)
POTASSIUM: 4.1 meq/L (ref 3.5–5.1)
SODIUM: 141 meq/L (ref 135–145)
Total Bilirubin: 0.3 mg/dL (ref 0.2–1.2)
Total Protein: 7.3 g/dL (ref 6.0–8.3)

## 2015-12-29 LAB — LIPID PANEL
CHOL/HDL RATIO: 3
Cholesterol: 220 mg/dL — ABNORMAL HIGH (ref 0–200)
HDL: 81.9 mg/dL (ref >39.00)
LDL Cholesterol: 108 mg/dL — ABNORMAL HIGH (ref 0–99)
NONHDL: 137.71
Triglycerides: 149 mg/dL (ref 0.0–149.0)
VLDL: 29.8 mg/dL (ref 0.0–40.0)

## 2015-12-29 LAB — TSH: TSH: 2.49 u[IU]/mL (ref 0.35–4.50)

## 2015-12-29 NOTE — Progress Notes (Signed)
Patient ID: Brianna Perry, female    DOB: [birth year]  Age: 82 y.o. MRN: [id number]  The patient is here for annual physical examination and management of other chronic and acute problems.   She has gained 8 lbs since last visit  Has stopped taking Halcion,  alprazolam  All other hypnotics for her  insomnia.  Colon Ca screening: History of Polyps jan 2017   5 yr follow up recommended if health ok  Per Byrnett Needs annual diagnostic mammogram or history of BRCA 2012(releaased by [facility] Ormond)   The risk factors are reflected in the social history.  The roster of all physicians providing medical care to patient - is listed in the Snapshot section of the chart.  Activities of daily living:  The patient is 100% independent in all ADLs: dressing, toileting, feeding as well as independent mobility  Home safety : The patient has smoke detectors in the home. They wear seatbelts.  There are no firearms at home. There is no violence in the home.   There is no risks for hepatitis, STDs or HIV. There is no   history of blood transfusion. They have no travel history to infectious disease endemic areas of the world.  The patient has seen their dentist in the last six month. They have NOT seen their eye doctor in the last year.  Dr Katy Fitch  Did bilatera cataract extraction with lens implantations over 10 years ago   They do not have any  hearing difficulty with regard to whispered voices  They have deferred audiologic testing in the last year.  They do not  have excessive sun exposure. Discussed the need for sun protection: hats, long sleeves and use of sunscreen if there is significant sun exposure.   Diet: the importance of a healthy diet is discussed. They do have a healthy diet.  The benefits of regular aerobic exercise were discussed. She walks 4 times per week ,  20 minutes.   Depression screen: there are no signs or vegative symptoms of depression- irritability, change in appetite, anhedonia,  sadness/tearfullness.  Cognitive assessment: the patient manages all their financial and personal affairs and is actively engaged. They could relate day,date,year and events; recalled 2/3 objects at 3 minutes; performed clock-face test normally.  The following portions of the patient's history were reviewed and updated as appropriate: allergies, current medications, past family history, past medical history,  past surgical history, past social history  and problem list.  Visual acuity was not assessed per patient preference since she has regular follow up with her ophthalmologist. Hearing and body mass index were assessed and reviewed.   During the course of the visit the patient was educated and counseled about appropriate screening and preventive services including : fall prevention , diabetes screening, nutrition counseling, colorectal cancer screening, and recommended immunizations.    CC: The primary encounter diagnosis was Hx of breast cancer. Diagnoses of Class 1 obesity due to excess calories without serious comorbidity with body mass index (BMI) of 33.0 to 33.9 in adult, Vitamin D deficiency, Persistent cough for 3 weeks or longer, Fatigue, unspecified type, Cough, Hematochezia, Routine physical examination, Chronic insomnia, and Personal history of breast cancer were also pertinent to this visit.   1) dry cough which is intermittent throughout the day for several months . Wakes up and  right side of face feeling pressure,  Rhinitis is clear. Gets nosebleeds occasionally  History Courtne has a past medical history of Arthritis; Breast cancer ([place]) (2010); Breast mass (x  couple months); Cancer ([place]) (2010); Chickenpox; GERD (gastroesophageal reflux disease); and Urine incontinence.   She has a past surgical history that includes Breast surgery (Right, 2010); Abdominal hysterectomy (1968); Tonsillectomy (1941); Breast cyst aspiration (Left, 2015); Augmentation mammaplasty (Bilateral, 1973);  Colonoscopy; Eye surgery; Tonsillectomy; and Colonoscopy with propofol (N/A, 03/23/2015).   Her family history includes Arthritis in her mother; Breast cancer in her paternal aunt; Heart disease in her father and mother; Hypertension in her mother.She reports that she has never smoked. She has never used smokeless tobacco. She reports that she does not drink alcohol or use drugs.  Outpatient Medications Prior to Visit  Medication Sig Dispense Refill  . aspirin 81 MG tablet Take 81 mg by mouth daily.    . calcium carbonate (OS-CAL) 600 MG TABS tablet Take 1,200 mg by mouth daily.    . Multiple Vitamin (MULTIVITAMIN) capsule Take 1 capsule by mouth daily.    . Multiple Vitamins-Minerals (RA VISION-VITE PRESERVE PO) Take 1 tablet by mouth daily.    . Omega-3 Fatty Acids (FISH OIL) 300 MG CAPS Take 1 capsule by mouth daily.    [name] omeprazole (PRILOSEC) 20 MG capsule take 1 capsule by mouth every morning 90 capsule 1  . TURMERIC PO Take 1 capsule by mouth daily.    [name] ALPRAZolam (XANAX) 0.5 MG tablet Take 1 tablet (0.5 mg total) by mouth at bedtime as needed for sleep. (Patient not taking: Reported on 12/29/2015) 30 tablet 3  . Doxepin HCl 3 MG TABS Take 1 tablet (3 mg total) by mouth at bedtime as needed (insomnia). (Patient not taking: Reported on 12/29/2015) 30 tablet 3  . polyethylene glycol powder (GLYCOLAX/MIRALAX) powder Take 255 g by mouth once. (Patient not taking: Reported on 12/29/2015) 255 g 0  . triazolam (HALCION) 0.25 MG tablet TAKE 1 TABLET BY MOUTH AT BEDTIME (Patient not taking: Reported on 12/29/2015) 30 tablet 5   No facility-administered medications prior to visit.     Review of Systems   Patient denies headache, fevers, malaise, unintentional weight loss, skin rash, eye pain, sinus congestion and sinus pain, sore throat, dysphagia,  hemoptysis , cough, dyspnea, wheezing, chest pain, palpitations, orthopnea, edema, abdominal pain, nausea, melena, diarrhea, constipation, flank pain,  dysuria, hematuria, urinary  Frequency, nocturia, numbness, tingling, seizures,  Focal weakness, Loss of consciousness,  Tremor, insomnia, depression, anxiety, and suicidal ideation.      Objective:  BP 134/82   Pulse 67   Temp 98 F (36.7 C) (Oral)   Resp 12   Ht 5' 3.75" (1.619 m)   Wt 198 lb 4 oz (89.9 kg)   SpO2 96%   BMI 34.30 kg/m   Physical Exam  General appearance: alert, cooperative and appears stated age Head: Normocephalic, without obvious abnormality, atraumatic Eyes: conjunctivae/corneas clear. PERRL, EOM's intact. Fundi benign. Ears: bilateral cerumen impaction ormal TM's and external ear canals both ears Nose: Nares normal. Septum midline. Mucosa normal. No drainage or sinus tenderness. Throat: lips, mucosa, and tongue normal; teeth and gums normal Neck: no adenopathy, no carotid bruit, no JVD, supple, symmetrical, trachea midline and thyroid not enlarged, symmetric, no tenderness/mass/nodules Lungs: clear to auscultation bilaterally Breasts: normal appearance, no masses or tenderness Heart: regular rate and rhythm, S1, S2 normal, no murmur, click, rub or gallop Abdomen: soft, non-tender; bowel sounds normal; no masses,  no organomegaly Extremities: extremities normal, atraumatic, no cyanosis or edema Pulses: 2+ and symmetric Skin: Skin color, texture, turgor normal. No rashes or lesions Neurologic: Alert and oriented X 3,  normal strength and tone. Normal symmetric reflexes. Normal coordination and gait.     Assessment & Plan:   Problem List Items Addressed This Visit    Personal history of breast cancer    Right breast  CA diagnosed in 2010.  She is S/p lumpectomy and XRT in [place].  She  finished 5 years of  Tamoxifen in 2015 Annual diagnostic mammogram has been ordered and breast exam was done. She has retro glandular implants bilaterally        Chronic insomnia    She has successfully weaned herself off of all hypnotics      Obesity    I have  addressed  BMI and recommended wt loss of 10% of body weight over the next 6 months using a low fat, low starch, high protein  fruit/vegetable based Mediterranean diet and 30 minutes of aerobic exercise a minimum of 5 days per week.        Relevant Orders   Comprehensive metabolic panel (Completed)   Lipid panel (Completed)   Hematochezia    Adenomatous polyps on colonoscopy by Byrnett in Jan 20-17      Cough    Chest x ray is clear but coarse I/s markings suggest bronchitis.  Prednisne taper , cough suppressant.       Routine physical examination    Annual comprehensive preventive exam was done as well as an evaluation and management of chronic conditions .  During the course of the visit the patient was educated and counseled about appropriate screening and preventive services including :  diabetes screening, lipid analysis with projected  10 year  risk for CAD , nutrition counseling, breast, cervical and colorectal cancer screening, and recommended immunizations.  Printed recommendations for health maintenance screenings was give       Other Visit Diagnoses    Hx of breast cancer    -  Primary   Relevant Orders   Lipid panel (Completed)   MM Digital Diagnostic Bilat   Vitamin D deficiency       Relevant Orders   VITAMIN D 25 Hydroxy (Vit-D Deficiency, Fractures) (Completed)   Persistent cough for 3 weeks or longer       Relevant Orders   DG Chest 2 View (Completed)   Fatigue, unspecified type       Relevant Orders   TSH (Completed)   CBC with Differential/Platelet (Completed)      I have discontinued Ms. Krygier's triazolam, polyethylene glycol powder, ALPRAZolam, and Doxepin HCl. I am also having her start on predniSONE. Additionally, I am having her maintain her multivitamin, FISH OIL, calcium carbonate, aspirin, Multiple Vitamins-Minerals (RA VISION-VITE PRESERVE PO), TURMERIC PO, and omeprazole.  Meds ordered this encounter  Medications  . predniSONE (DELTASONE) 10  MG tablet    Sig: 6 tablets on Day 1 , then reduce by 1 tablet daily until gone    Dispense:  21 tablet    Refill:  0    Medications Discontinued During This Encounter  Medication Reason  . triazolam (HALCION) 0.25 MG tablet   . polyethylene glycol powder (GLYCOLAX/MIRALAX) powder   . Doxepin HCl 3 MG TABS   . ALPRAZolam (XANAX) 0.5 MG tablet     Follow-up: No Follow-up on file.   Crecencio Mc, MD

## 2015-12-29 NOTE — Patient Instructions (Addendum)
Please Go get your eyes examined   Chest x ray today to investigate cough.  If it is normal,  I suggest irrigating your sinuses once or twice daily with Milta Deiters Med's sinus rinse, followed by applying vaseline to the inside of your nose to prevent nosebleeds  If the cough and right sided head pain persist, I will have you see ENT to evaluate your sinuses  Medicare Wellness visit to be scheduled with Denisa   Menopause is a normal process in which your reproductive ability comes to an end. This process happens gradually over a span of months to years, usually between the ages of 38 and 44. Menopause is complete when you have missed 12 consecutive menstrual periods. It is important to talk with your health care provider about some of the most common conditions that affect postmenopausal women, such as heart disease, cancer, and bone loss (osteoporosis). Adopting a healthy lifestyle and getting preventive care can help to promote your health and wellness. Those actions can also lower your chances of developing some of these common conditions. WHAT SHOULD I KNOW ABOUT MENOPAUSE? During menopause, you may experience a number of symptoms, such as:  Moderate-to-severe hot flashes.  Night sweats.  Decrease in sex drive.  Mood swings.  Headaches.  Tiredness.  Irritability.  Memory problems.  Insomnia. Choosing to treat or not to treat menopausal changes is an individual decision that you make with your health care provider. WHAT SHOULD I KNOW ABOUT HORMONE REPLACEMENT THERAPY AND SUPPLEMENTS? Hormone therapy products are effective for treating symptoms that are associated with menopause, such as hot flashes and night sweats. Hormone replacement carries certain risks, especially as you become older. If you are thinking about using estrogen or estrogen with progestin treatments, discuss the benefits and risks with your health care provider. WHAT SHOULD I KNOW ABOUT HEART DISEASE AND STROKE? Heart  disease, heart attack, and stroke become more likely as you age. This may be due, in part, to the hormonal changes that your body experiences during menopause. These can affect how your body processes dietary fats, triglycerides, and cholesterol. Heart attack and stroke are both medical emergencies. There are many things that you can do to help prevent heart disease and stroke:  Have your blood pressure checked at least every 1-2 years. High blood pressure causes heart disease and increases the risk of stroke.  If you are 25-5 years old, ask your health care provider if you should take aspirin to prevent a heart attack or a stroke.  Do not use any tobacco products, including cigarettes, chewing tobacco, or electronic cigarettes. If you need help quitting, ask your health care provider.  It is important to eat a healthy diet and maintain a healthy weight.  Be sure to include plenty of vegetables, fruits, low-fat dairy products, and lean protein.  Avoid eating foods that are high in solid fats, added sugars, or salt (sodium).  Get regular exercise. This is one of the most important things that you can do for your health.  Try to exercise for at least 150 minutes each week. The type of exercise that you do should increase your heart rate and make you sweat. This is known as moderate-intensity exercise.  Try to do strengthening exercises at least twice each week. Do these in addition to the moderate-intensity exercise.  Know your numbers.Ask your health care provider to check your cholesterol and your blood glucose. Continue to have your blood tested as directed by your health care provider. WHAT SHOULD  I KNOW ABOUT CANCER SCREENING? There are several types of cancer. Take the following steps to reduce your risk and to catch any cancer development as early as possible. Breast Cancer  Practice breast self-awareness.  This means understanding how your breasts normally appear and feel.  It  also means doing regular breast self-exams. Let your health care provider know about any changes, no matter how small.  If you are 24 or older, have a clinician do a breast exam (clinical breast exam or CBE) every year. Depending on your age, family history, and medical history, it may be recommended that you also have a yearly breast X-ray (mammogram).  If you have a family history of breast cancer, talk with your health care provider about genetic screening.  If you are at high risk for breast cancer, talk with your health care provider about having an MRI and a mammogram every year.  Breast cancer (BRCA) gene test is recommended for women who have family members with BRCA-related cancers. Results of the assessment will determine the need for genetic counseling and BRCA1 and for BRCA2 testing. BRCA-related cancers include these types:  Breast. This occurs in males or females.  Ovarian.  Tubal. This may also be called fallopian tube cancer.  Cancer of the abdominal or pelvic lining (peritoneal cancer).  Prostate.  Pancreatic. Cervical, Uterine, and Ovarian Cancer Your health care provider may recommend that you be screened regularly for cancer of the pelvic organs. These include your ovaries, uterus, and vagina. This screening involves a pelvic exam, which includes checking for microscopic changes to the surface of your cervix (Pap test).  For women ages 21-65, health care providers may recommend a pelvic exam and a Pap test every three years. For women ages 74-65, they may recommend the Pap test and pelvic exam, combined with testing for human papilloma virus (HPV), every five years. Some types of HPV increase your risk of cervical cancer. Testing for HPV may also be done on women of any age who have unclear Pap test results.  Other health care providers may not recommend any screening for nonpregnant women who are considered low risk for pelvic cancer and have no symptoms. Ask your  health care provider if a screening pelvic exam is right for you.  If you have had past treatment for cervical cancer or a condition that could lead to cancer, you need Pap tests and screening for cancer for at least 20 years after your treatment. If Pap tests have been discontinued for you, your risk factors (such as having a new sexual partner) need to be reassessed to determine if you should start having screenings again. Some women have medical problems that increase the chance of getting cervical cancer. In these cases, your health care provider may recommend that you have screening and Pap tests more often.  If you have a family history of uterine cancer or ovarian cancer, talk with your health care provider about genetic screening.  If you have vaginal bleeding after reaching menopause, tell your health care provider.  There are currently no reliable tests available to screen for ovarian cancer. Lung Cancer Lung cancer screening is recommended for adults 67-80 years old who are at high risk for lung cancer because of a history of smoking. A yearly low-dose CT scan of the lungs is recommended if you:  Currently smoke.  Have a history of at least 30 pack-years of smoking and you currently smoke or have quit within the past 15 years. A pack-year  is smoking an average of one pack of cigarettes per day for one year. Yearly screening should:  Continue until it has been 15 years since you quit.  Stop if you develop a health problem that would prevent you from having lung cancer treatment. Colorectal Cancer  This type of cancer can be detected and can often be prevented.  Routine colorectal cancer screening usually begins at age 51 and continues through age 11.  If you have risk factors for colon cancer, your health care provider may recommend that you be screened at an earlier age.  If you have a family history of colorectal cancer, talk with your health care provider about genetic  screening.  Your health care provider may also recommend using home test kits to check for hidden blood in your stool.  A small camera at the end of a tube can be used to examine your colon directly (sigmoidoscopy or colonoscopy). This is done to check for the earliest forms of colorectal cancer.  Direct examination of the colon should be repeated every 5-10 years until age 63. However, if early forms of precancerous polyps or small growths are found or if you have a family history or genetic risk for colorectal cancer, you may need to be screened more often. Skin Cancer  Check your skin from head to toe regularly.  Monitor any moles. Be sure to tell your health care provider:  About any new moles or changes in moles, especially if there is a change in a mole's shape or color.  If you have a mole that is larger than the size of a pencil eraser.  If any of your family members has a history of skin cancer, especially at a young age, talk with your health care provider about genetic screening.  Always use sunscreen. Apply sunscreen liberally and repeatedly throughout the day.  Whenever you are outside, protect yourself by wearing long sleeves, pants, a wide-brimmed hat, and sunglasses. WHAT SHOULD I KNOW ABOUT OSTEOPOROSIS? Osteoporosis is a condition in which bone destruction happens more quickly than new bone creation. After menopause, you may be at an increased risk for osteoporosis. To help prevent osteoporosis or the bone fractures that can happen because of osteoporosis, the following is recommended:  If you are 52-42 years old, get at least 1,000 mg of calcium and at least 600 mg of vitamin D per day.  If you are older than age 46 but younger than age 61, get at least 1,200 mg of calcium and at least 600 mg of vitamin D per day.  If you are older than age 27, get at least 1,200 mg of calcium and at least 800 mg of vitamin D per day. Smoking and excessive alcohol intake increase the  risk of osteoporosis. Eat foods that are rich in calcium and vitamin D, and do weight-bearing exercises several times each week as directed by your health care provider. WHAT SHOULD I KNOW ABOUT HOW MENOPAUSE AFFECTS [place]? Depression may occur at any age, but it is more common as you become older. Common symptoms of depression include:  Low or sad mood.  Changes in sleep patterns.  Changes in appetite or eating patterns.  Feeling an overall lack of motivation or enjoyment of activities that you previously enjoyed.  Frequent crying spells. Talk with your health care provider if you think that you are experiencing depression. WHAT SHOULD I KNOW ABOUT IMMUNIZATIONS? It is important that you get and maintain your immunizations. These include:  Tetanus, diphtheria, and pertussis (Tdap) booster vaccine.  Influenza every year before the flu season begins.  Pneumonia vaccine.  Shingles vaccine. Your health care provider may also recommend other immunizations.   This information is not intended to replace advice given to you by your health care provider. Make sure you discuss any questions you have with your health care provider.   Document Released: 04/07/2005 Document Revised: 03/06/2014 Document Reviewed: 10/16/2013 Elsevier Interactive Patient Education [employer].

## 2015-12-29 NOTE — Progress Notes (Signed)
Pre-visit discussion using our clinic review tool. No additional management support is needed unless otherwise documented below in the visit note.  

## 2015-12-30 ENCOUNTER — Encounter: Payer: Self-pay | Admitting: Internal Medicine

## 2015-12-30 DIAGNOSIS — Z299 Encounter for prophylactic measures, unspecified: Secondary | ICD-10-CM | POA: Insufficient documentation

## 2015-12-30 DIAGNOSIS — R059 Cough, unspecified: Secondary | ICD-10-CM | POA: Insufficient documentation

## 2015-12-30 DIAGNOSIS — Z0001 Encounter for general adult medical examination with abnormal findings: Secondary | ICD-10-CM | POA: Insufficient documentation

## 2015-12-30 DIAGNOSIS — R05 Cough: Secondary | ICD-10-CM | POA: Insufficient documentation

## 2015-12-30 MED ORDER — PREDNISONE 10 MG PO TABS: ORAL_TABLET | ORAL | 0 refills | Status: DC

## 2015-12-30 NOTE — Assessment & Plan Note (Signed)
Adenomatous polyps on colonoscopy by Byrnett in Jan 20-17

## 2015-12-30 NOTE — Assessment & Plan Note (Signed)
I have addressed  BMI and recommended wt loss of 10% of body weight over the next 6 months using a low fat, low starch, high protein  fruit/vegetable based Mediterranean diet and 30 minutes of aerobic exercise a minimum of 5 days per week.   

## 2015-12-30 NOTE — Assessment & Plan Note (Signed)
She has successfully weaned herself off of all hypnotics

## 2015-12-30 NOTE — Assessment & Plan Note (Signed)
Annual comprehensive preventive exam was done as well as an evaluation and management of chronic conditions .  During the course of the visit the patient was educated and counseled about appropriate screening and preventive services including :  diabetes screening, lipid analysis with projected  10 year  risk for CAD , nutrition counseling, breast, cervical and colorectal cancer screening, and recommended immunizations.  Printed recommendations for health maintenance screenings was give 

## 2015-12-30 NOTE — Assessment & Plan Note (Addendum)
Right breast  CA diagnosed in 2010.  She is S/p lumpectomy and XRT in [place].  She  finished 5 years of  Tamoxifen in 2015 Annual diagnostic mammogram has been ordered and breast exam was done. She has retro glandular implants bilaterally

## 2015-12-30 NOTE — Assessment & Plan Note (Signed)
Chest x ray is clear but coarse I/s markings suggest bronchitis.  Prednisne taper , cough suppressant.

## 2016-01-04 ENCOUNTER — Ambulatory Visit (INDEPENDENT_AMBULATORY_CARE_PROVIDER_SITE_OTHER): Payer: Medicare Other

## 2016-01-04 VITALS — BP 132/82 | HR 79 | Temp 97.8°F | Resp 14 | Ht 63.75 in | Wt 198.0 lb

## 2016-01-04 DIAGNOSIS — Z0000 Encounter for general adult medical examination without abnormal findings: Secondary | ICD-10-CM | POA: Diagnosis not present

## 2016-01-04 NOTE — Progress Notes (Signed)
Subjective:   Brianna Perry is a 82 y.o. female who presents for Medicare Annual (Subsequent) preventive examination.  Review of Systems:  No ROS.  Medicare Wellness Visit.  Cardiac Risk Factors include: advanced age (>59men, >83 women);obesity (BMI >30kg/m2)     Objective:     Vitals: BP 132/82 (BP Location: Left Arm, Patient Position: Sitting, Cuff Size: Normal)   Pulse 79   Temp 97.8 F (36.6 C) (Oral)   Resp 14   Ht 5' 3.75" (1.619 m)   Wt 198 lb (89.8 kg)   SpO2 95%   BMI 34.25 kg/m   Body mass index is 34.25 kg/m.   Tobacco History  Smoking Status  . Never Smoker  Smokeless Tobacco  . Never Used     Counseling given: Not Answered   Past Medical History:  Diagnosis Date  . Arthritis   . Breast cancer ([place]) 2010   Right Breast c lumpectomy c radiation  . Breast mass x couple months   Right Breast  . Cancer ([place]) 2010   Right breast  . Chickenpox   . GERD (gastroesophageal reflux disease)   . Urine incontinence    Past Surgical History:  Procedure Laterality Date  . ABDOMINAL HYSTERECTOMY  1968  . AUGMENTATION MAMMAPLASTY Bilateral 1973  . BREAST CYST ASPIRATION Left 2015   Negative  . BREAST SURGERY Right 2010  . [place]  . COLONOSCOPY WITH PROPOFOL N/A 03/23/2015   Procedure: COLONOSCOPY WITH PROPOFOL;  Surgeon: [name], MD;  Location: [facility] ENDOSCOPY;  Service: Endoscopy;  Laterality: N/A;  . EYE SURGERY    . TONSILLECTOMY  1941  . TONSILLECTOMY     Family History  Problem Relation Age of Onset  . Arthritis Mother   . Heart disease Mother   . Hypertension Mother   . Heart disease Father   . Breast cancer Paternal Aunt     39s   History  Sexual Activity  . Sexual activity: No    Outpatient Encounter Prescriptions as of 01/04/2016  Medication Sig  . aspirin 81 MG tablet Take 81 mg by mouth daily.  . calcium carbonate (OS-CAL) 600 MG TABS tablet Take 1,200 mg by mouth daily.  . Multiple Vitamin  (MULTIVITAMIN) capsule Take 1 capsule by mouth daily.  . Multiple Vitamins-Minerals (RA VISION-VITE PRESERVE PO) Take 1 tablet by mouth daily.  . Omega-3 Fatty Acids (FISH OIL) 300 MG CAPS Take 1 capsule by mouth daily.  [name] omeprazole (PRILOSEC) 20 MG capsule take 1 capsule by mouth every morning  . TURMERIC PO Take 1 capsule by mouth daily.  . [DISCONTINUED] predniSONE (DELTASONE) 10 MG tablet 6 tablets on Day 1 , then reduce by 1 tablet daily until gone   No facility-administered encounter medications on file as of 01/04/2016.     Activities of Daily Living In your present state of health, do you have any difficulty performing the following activities: 01/04/2016  Hearing? N  Vision? N  Difficulty concentrating or making decisions? N  Walking or climbing stairs? N  Dressing or bathing? N  Doing errands, shopping? N  Preparing Food and eating ? N  Using the Toilet? N  In the past six months, have you accidently leaked urine? Y  Do you have problems with loss of bowel control? N  Managing your Medications? N  Managing your Finances? N  Housekeeping or managing your Housekeeping? N  Some recent data might be hidden    Patient  Care Team: Crecencio Mc, MD as PCP - General (Internal Medicine) Crecencio Mc, MD (Internal Medicine) [name], MD (General Surgery)    Assessment:    This is a routine wellness examination for Brianna Perry. The goal of the wellness visit is to assist the patient how to close the gaps in care and create a preventative care plan for the patient.   Taking calcium OS-CAL as appropriate/Osteoporosis risk reviewed.  Medications reviewed; taking without issues or barriers.  Safety issues reviewed; lives at [facility] with her husband. Smoke detectors in the home. No firearms in the home. Wears seatbelts when driving or riding with others. No violence in the home.  No identified risk were noted; The patient was oriented x 3; appropriate in dress and  manner and no objective failures at ADL's or IADL's.   Body mass index; discussed the importance of a healthy diet, water intake and exercise. She and her husband cook their own meals and tries to have a healthy diet.  Low carb educational material provided. She has adequate water intake and walks for exercise.     Health maintenance gaps; closed.  Patient Concerns: Advanced Directives short forms discussed today.  Concerned the paperwork may be more than desired; may only want gold DNR form going forward.  Enouraged to follow up with PCP for additional advanced directive appointment if needed.  Exercise Activities and Dietary recommendations Current Exercise Habits: Home exercise routine, Type of exercise: walking, Frequency (Times/Week): 3, Intensity: Mild  Goals    . Increase physical activity          Increase 30 minute walking from 3 to 5 days.      Fall Risk Fall Risk  01/04/2016 12/29/2015 12/28/2014 12/22/2013  Falls in the past year? No No Yes No  Number falls in past yr: - - 1 -  Injury with Fall? - - No -  Follow up - - Education provided -   Depression Screen PHQ 2/9 Scores 01/04/2016 12/29/2015 12/28/2014 12/22/2013  PHQ - 2 Score 0 0 0 0     Cognitive Function MMSE - Mini Mental State Exam 01/04/2016  Orientation to time 5  Orientation to Place 5  Registration 3  Attention/ Calculation 5  Recall 3  Language- name 2 objects 2  Language- repeat 1  Language- follow 3 step command 3  Language- read & follow direction 1  Write a sentence 1  Copy design 1  Total score 30        Immunization History  Administered Date(s) Administered  . Influenza,inj,Quad PF,36+ Mos 11/27/2012  . Influenza-Unspecified 12/01/2013, 12/17/2014, 12/23/2015  . Pneumococcal Conjugate-13 12/22/2013  . Pneumococcal Polysaccharide-23 11/27/2009, 12/28/2014  . Tdap 07/19/2013  . Zoster 11/28/2010   Screening Tests Health Maintenance  Topic Date Due  . TETANUS/TDAP  07/20/2023  .  INFLUENZA VACCINE  Completed  . DEXA SCAN  Completed  . ZOSTAVAX  Completed  . PNA vac Low Risk Adult  Completed      Plan:   End of life planning; Advance aging; Advanced directives discussed. No HCPOA/Living Will.  Additional information provided to help her start the conversation with her family.  Copy of HCPOA/Living Will short forms requested upon completion.  Total time spent on this topic is 16 minutes.  Follow up with PCP as needed.  Medicare Attestation I have personally reviewed: The patient's medical and social history Their use of alcohol, tobacco or illicit drugs Their current medications and supplements The patient's functional  ability including ADLs,fall risks, home safety risks, cognitive, and hearing and visual impairment Diet and physical activities Evidence for depression   The patient's weight, height, BMI, and visual acuity have been recorded in the chart.  I have made referrals and provided education to the patient based on review of the above and I have provided the patient with a written personalized care plan for preventive services.    During the course of the visit the patient was educated and counseled about the following appropriate screening and preventive services:   Vaccines to include Pneumoccal, Influenza, Hepatitis B, Td, Zostavax, HCV  Electrocardiogram  Cardiovascular Disease  Colorectal cancer screening  Bone density screening  Diabetes screening  Glaucoma screening  Mammography/PAP  Nutrition counseling   Patient Instructions (the written plan) was given to the patient.   Varney Biles, LPN  [id number]   Reviewed above.  Agree with plan.    Dr Nicki Reaper

## 2016-01-04 NOTE — Patient Instructions (Addendum)
  Brianna Perry , Thank you for taking time to come for your Medicare Wellness Visit. I appreciate your ongoing commitment to your health goals. Please review the following plan we discussed and let me know if I can assist you in the future.   These are the goals we discussed: Goals    . Increase physical activity          Increase 30 minute walking from 3 to 5 days.       This is a list of the screening recommended for you and due dates:  Health Maintenance  Topic Date Due  . Tetanus Vaccine  07/20/2023  . Flu Shot  Completed  . DEXA scan (bone density measurement)  Completed  . Shingles Vaccine  Completed  . Pneumonia vaccines  Completed

## 2016-02-10 ENCOUNTER — Encounter: Payer: Self-pay | Admitting: Internal Medicine

## 2016-02-28 HISTORY — PX: BREAST CYST ASPIRATION: SHX578

## 2016-03-07 ENCOUNTER — Other Ambulatory Visit: Payer: Self-pay | Admitting: Internal Medicine

## 2016-03-07 ENCOUNTER — Ambulatory Visit
Admission: RE | Admit: 2016-03-07 | Discharge: 2016-03-07 | Disposition: A | Discharge status: Home / Self Care | Payer: PPO | Source: Ambulatory Visit | Attending: Internal Medicine | Admitting: Internal Medicine

## 2016-03-07 DIAGNOSIS — R928 Other abnormal and inconclusive findings on diagnostic imaging of breast: Secondary | ICD-10-CM

## 2016-03-07 DIAGNOSIS — N63 Unspecified lump in unspecified breast: Secondary | ICD-10-CM | POA: Insufficient documentation

## 2016-03-07 DIAGNOSIS — Z853 Personal history of malignant neoplasm of breast: Secondary | ICD-10-CM | POA: Insufficient documentation

## 2016-03-07 DIAGNOSIS — N6320 Unspecified lump in the left breast, unspecified quadrant: Secondary | ICD-10-CM

## 2016-03-07 DIAGNOSIS — N6489 Other specified disorders of breast: Secondary | ICD-10-CM | POA: Diagnosis not present

## 2016-03-08 ENCOUNTER — Encounter: Payer: Self-pay | Admitting: Internal Medicine

## 2016-03-08 DIAGNOSIS — N6002 Solitary cyst of left breast: Secondary | ICD-10-CM | POA: Insufficient documentation

## 2016-03-09 ENCOUNTER — Other Ambulatory Visit: Payer: Self-pay | Admitting: Internal Medicine

## 2016-03-09 DIAGNOSIS — N6320 Unspecified lump in the left breast, unspecified quadrant: Secondary | ICD-10-CM

## 2016-03-09 NOTE — Progress Notes (Unsigned)
Referral to Dr Bary Castilla for breast biopsy is in progress.   Regards,   Deborra Medina, MD

## 2016-03-10 NOTE — Progress Notes (Unsigned)
Patient aware.

## 2016-03-13 ENCOUNTER — Ambulatory Visit: Payer: PPO | Admitting: General Surgery

## 2016-03-16 ENCOUNTER — Ambulatory Visit: Payer: PPO | Admitting: General Surgery

## 2016-03-20 ENCOUNTER — Ambulatory Visit: Payer: PPO | Admitting: General Surgery

## 2016-03-22 ENCOUNTER — Encounter: Payer: Self-pay | Admitting: General Surgery

## 2016-03-22 ENCOUNTER — Inpatient Hospital Stay: Payer: Self-pay

## 2016-03-22 ENCOUNTER — Ambulatory Visit (INDEPENDENT_AMBULATORY_CARE_PROVIDER_SITE_OTHER): Payer: PPO | Admitting: General Surgery

## 2016-03-22 VITALS — BP 128/78 | HR 74 | Resp 12 | Ht 63.7 in | Wt 200.0 lb

## 2016-03-22 DIAGNOSIS — N6002 Solitary cyst of left breast: Secondary | ICD-10-CM

## 2016-03-22 DIAGNOSIS — N6320 Unspecified lump in the left breast, unspecified quadrant: Secondary | ICD-10-CM

## 2016-03-22 NOTE — Progress Notes (Signed)
Patient ID: Brianna Perry, female   DOB: 25-Jun-1932, 83 y.o.   MRN: [id number]  Chief Complaint  Patient presents with  . Breast Problem    HPI Brianna Perry is a 83 y.o. female.  who presents for a breast evaluation. The most recent mammogram and ultrasound was done on 03-07-16.  Patient does perform regular self breast checks and gets regular mammograms done.   She can not feel anything different in the breast. Denies any breast trauma or injury.She has a history of right breast cancer approximately 8 years ago.[name]    HPI  Past Medical History:  Diagnosis Date  . Arthritis   . Breast cancer ([place]) 2010   Right Breast c lumpectomy with 10 weeks radiation  . Breast mass x couple months   Right Breast  . Cancer ([place]) 2010   Right breast  . Chickenpox   . GERD (gastroesophageal reflux disease)   . Urine incontinence     Past Surgical History:  Procedure Laterality Date  . ABDOMINAL HYSTERECTOMY  1968  . AUGMENTATION MAMMAPLASTY Bilateral 0000000   silicone/[place]  . BREAST CYST ASPIRATION Left 2015   Negative  . BREAST SURGERY Right 2010   Dr Clide Cliff in [place]  . [place]  . COLONOSCOPY WITH PROPOFOL N/A 03/23/2015   Procedure: COLONOSCOPY WITH PROPOFOL;  Surgeon: [name], MD;  Location: [facility] ENDOSCOPY;  Service: Endoscopy;  Laterality: N/A;  . EYE SURGERY    . TONSILLECTOMY  1941  . TONSILLECTOMY      Family History  Problem Relation Age of Onset  . Arthritis Mother   . Heart disease Mother   . Hypertension Mother   . Heart disease Father   . Breast cancer Paternal Aunt     105s  . Breast cancer Paternal Grandmother     great grandmother    Social History Social History  Substance Use Topics  . Smoking status: Never Smoker  . Smokeless tobacco: Never Used  . Alcohol use No    Allergies  Allergen Reactions  . Penicillins Swelling    Current Outpatient Prescriptions  Medication Sig Dispense Refill  . aspirin 81 MG  tablet Take 81 mg by mouth daily.    . calcium carbonate (OS-CAL) 600 MG TABS tablet Take 1,200 mg by mouth daily.    . Multiple Vitamin (MULTIVITAMIN) capsule Take 1 capsule by mouth daily.    . Multiple Vitamins-Minerals (RA VISION-VITE PRESERVE PO) Take 1 tablet by mouth daily.    . Omega-3 Fatty Acids (FISH OIL) 300 MG CAPS Take 1 capsule by mouth daily.    [name] omeprazole (PRILOSEC) 20 MG capsule take 1 capsule by mouth every morning 90 capsule 1  . TURMERIC PO Take 1 capsule by mouth daily.     No current facility-administered medications for this visit.     Review of Systems Review of Systems  Constitutional: Negative.   Respiratory: Negative.   Cardiovascular: Negative.     Blood pressure 128/78, pulse 74, resp. rate 12, height 5' 3.7" (1.618 m), weight 200 lb (90.7 kg).  Physical Exam Physical Exam  Constitutional: She is oriented to person, place, and time. She appears well-developed and well-nourished.  HENT:  Mouth/Throat: Oropharynx is clear and moist.  Eyes: Conjunctivae are normal. No scleral icterus.  Neck: Neck supple.  Cardiovascular: Normal rate, regular rhythm and normal heart sounds.   Pulmonary/Chest: Effort normal and breath sounds normal. Right breast exhibits no inverted nipple, no  mass, no nipple discharge, no skin change and no tenderness. Left breast exhibits no inverted nipple, no mass, no nipple discharge, no skin change and no tenderness.  Thickening right breast at scar site  Lymphadenopathy:    She has no cervical adenopathy.    She has no axillary adenopathy.  Neurological: She is alert and oriented to person, place, and time.  Skin: Skin is warm and dry.  Psychiatric: Her behavior is normal.    Data Reviewed 03/07/2016 bilateral mammograms and ultrasound reviewed. Well-defined 5 mm density in the left breast 3:00 position. BI-RADS-4.  Review of the 2015 study showed a similar appearing mammographic finding.  Options for ultrasound-guided  aspiration was discussed with the patient and she was amenable.  Ultrasound examination showed a 0.38 x 0.42 x 0.57 well-defined hypoechoic nodule resting on the pectoralis fascia. A few internal echoes were identified. This was an indeterminate lesion. Alcohol skin prep followed by 1 mL of 1% plain Xylocaine. The lesion was aspirated with complete resolution returning a small lime of clear yellow fluid skin fluid. This was discarded. The procedure was well tolerated. Simple cyst left breast, BI-RADS-2.  Assessment    Left breast cyst, resolved on aspiration.    Plan    The patient will continue annual mammograms as previously scheduled her PCP.    Follow up as needed.  This information has been scribed by Karie Fetch RN, BSN,BC.   [name] 03/23/2016, 2:16 PM

## 2016-03-22 NOTE — Patient Instructions (Signed)
The patient is aware to call back for any questions or concerns.  

## 2016-03-23 ENCOUNTER — Encounter: Payer: Self-pay | Admitting: General Surgery

## 2016-03-23 DIAGNOSIS — N6002 Solitary cyst of left breast: Secondary | ICD-10-CM | POA: Insufficient documentation

## 2016-03-29 ENCOUNTER — Encounter: Payer: Self-pay | Admitting: Internal Medicine

## 2016-03-30 ENCOUNTER — Other Ambulatory Visit: Payer: Self-pay | Admitting: Internal Medicine

## 2016-03-30 DIAGNOSIS — N390 Urinary tract infection, site not specified: Secondary | ICD-10-CM | POA: Insufficient documentation

## 2016-03-30 DIAGNOSIS — N3000 Acute cystitis without hematuria: Secondary | ICD-10-CM

## 2016-03-30 MED ORDER — CIPROFLOXACIN HCL 250 MG PO TABS: 250.0000 mg | ORAL_TABLET | Freq: Two times a day (BID) | ORAL | 0 refills | Status: DC

## 2016-04-22 ENCOUNTER — Other Ambulatory Visit: Payer: Self-pay | Admitting: Internal Medicine

## 2016-10-17 ENCOUNTER — Other Ambulatory Visit: Payer: Self-pay | Admitting: Internal Medicine

## 2016-11-14 ENCOUNTER — Ambulatory Visit (INDEPENDENT_AMBULATORY_CARE_PROVIDER_SITE_OTHER): Payer: PPO | Admitting: *Deleted

## 2016-11-14 ENCOUNTER — Encounter: Payer: Self-pay | Admitting: *Deleted

## 2016-11-14 ENCOUNTER — Encounter: Payer: Self-pay | Admitting: Internal Medicine

## 2016-11-14 ENCOUNTER — Telehealth: Payer: Self-pay | Admitting: Internal Medicine

## 2016-11-14 DIAGNOSIS — R300 Dysuria: Secondary | ICD-10-CM

## 2016-11-14 LAB — POCT URINALYSIS DIPSTICK
Bilirubin, UA: NEGATIVE
GLUCOSE UA: NEGATIVE
Ketones, UA: NEGATIVE
NITRITE UA: NEGATIVE
Protein, UA: NEGATIVE
Spec Grav, UA: 1.01 (ref 1.010–1.025)
Urobilinogen, UA: 0.2 E.U./dL
pH, UA: 5.5 (ref 5.0–8.0)

## 2016-11-14 LAB — URINALYSIS, MICROSCOPIC ONLY

## 2016-11-14 NOTE — Telephone Encounter (Signed)
Patient sent My chart message with C/O burning  Frequency in urination , no appointments so set patient up on nurse schedule to come in for urine, patient stated she is having a lot of burning ,no fever or chills. OK to recommend AZO?

## 2016-11-14 NOTE — Telephone Encounter (Signed)
Tried to reach patient by phone no answer, left message to call office.

## 2016-11-14 NOTE — Telephone Encounter (Signed)
Patient has been to office note sent to PCP.

## 2016-11-14 NOTE — Progress Notes (Addendum)
Patient presented for nurse visit for possible UTI patient had complaint of burning with urination for last 48 hours and frequency, patient staed burning was very uncomfortable , denied fever or chills . No abdominal discomfort or pain other than burning.    I have reviewed the above information and agree with above.   Please send UA, micro and culture   Deborra Medina, MD

## 2016-11-14 NOTE — Telephone Encounter (Signed)
Yes, if dipstick is abnormal I will treat

## 2016-11-16 ENCOUNTER — Telehealth: Payer: Self-pay | Admitting: Internal Medicine

## 2016-11-16 ENCOUNTER — Encounter: Payer: Self-pay | Admitting: Internal Medicine

## 2016-11-16 LAB — URINE CULTURE
MICRO NUMBER: 81030029
SPECIMEN QUALITY: ADEQUATE

## 2016-11-16 MED ORDER — CIPROFLOXACIN HCL 250 MG PO TABS: 250.0000 mg | ORAL_TABLET | Freq: Two times a day (BID) | ORAL | 0 refills | Status: DC

## 2016-11-16 NOTE — Telephone Encounter (Signed)
THE CULTURE DATA IS NOT BACK YET, OR I WOULD HAVE CALLED HER.  SENT CIPRO TO [facility]  IF SHE CANNOT WAIT.

## 2016-11-16 NOTE — Telephone Encounter (Signed)
Please advise 

## 2016-11-16 NOTE — Telephone Encounter (Signed)
Spoke with pt and informed her of the message below. Also told pt that we would give her a call as soon as we received all of the results. Pt gave verbal understanding.

## 2016-11-16 NOTE — Telephone Encounter (Signed)
Pt called requesting urine results. Please advise, thank you!  Call pt @ [phone]

## 2016-11-17 ENCOUNTER — Encounter: Payer: Self-pay | Admitting: Internal Medicine

## 2017-01-03 ENCOUNTER — Ambulatory Visit: Payer: PPO

## 2017-01-03 ENCOUNTER — Ambulatory Visit: Payer: Medicare Other

## 2017-01-10 ENCOUNTER — Ambulatory Visit: Payer: Self-pay

## 2017-01-11 ENCOUNTER — Other Ambulatory Visit: Payer: Self-pay

## 2017-01-11 ENCOUNTER — Ambulatory Visit (INDEPENDENT_AMBULATORY_CARE_PROVIDER_SITE_OTHER): Payer: PPO

## 2017-01-11 VITALS — BP 126/72 | HR 78 | Temp 98.1°F | Resp 15 | Ht 62.75 in | Wt 200.8 lb

## 2017-01-11 DIAGNOSIS — E559 Vitamin D deficiency, unspecified: Secondary | ICD-10-CM

## 2017-01-11 DIAGNOSIS — Z0000 Encounter for general adult medical examination without abnormal findings: Secondary | ICD-10-CM | POA: Diagnosis not present

## 2017-01-11 DIAGNOSIS — E7849 Other hyperlipidemia: Secondary | ICD-10-CM

## 2017-01-11 DIAGNOSIS — E6609 Other obesity due to excess calories: Secondary | ICD-10-CM

## 2017-01-11 DIAGNOSIS — Z6834 Body mass index (BMI) 34.0-34.9, adult: Secondary | ICD-10-CM

## 2017-01-11 DIAGNOSIS — Z1331 Encounter for screening for depression: Secondary | ICD-10-CM | POA: Diagnosis not present

## 2017-01-11 DIAGNOSIS — R5383 Other fatigue: Secondary | ICD-10-CM

## 2017-01-11 NOTE — Progress Notes (Signed)
Subjective:   Brianna Perry is a 83 y.o. female who presents for Medicare Annual (Subsequent) preventive examination.  Review of Systems:  No ROS.  Medicare Wellness Visit. Additional risk factors are reflected in the social history.  Cardiac Risk Factors include: advanced age (>66men, >41 women)     Objective:     Vitals: BP 126/72 (BP Location: Left Arm, Patient Position: Sitting, Cuff Size: Normal)   Pulse 78   Temp 98.1 F (36.7 C) (Oral)   Resp 15   Ht 5' 2.75" (1.594 m)   Wt 200 lb 12.8 oz (91.1 kg)   SpO2 97%   BMI 35.85 kg/m   Body mass index is 35.85 kg/m.   Tobacco Social History   Tobacco Use  Smoking Status Never Smoker  Smokeless Tobacco Never Used     Counseling given: Not Answered   Past Medical History:  Diagnosis Date  . Arthritis   . Breast cancer ([place]) 2010   DCIS; Right Breast c lumpectomy with 10 weeks radiation  . Breast mass x couple months   Right Breast  . Cancer ([place]) 2010   Right breast  . Chickenpox   . GERD (gastroesophageal reflux disease)   . Urine incontinence    Past Surgical History:  Procedure Laterality Date  . ABDOMINAL HYSTERECTOMY  1968  . AUGMENTATION MAMMAPLASTY Bilateral 9509   silicone/[place]  . BREAST CYST ASPIRATION Left 2015   Negative  . BREAST SURGERY Right 2010   Dr Clide Cliff in [place]  . [place]  . COLONOSCOPY WITH PROPOFOL N/A 03/23/2015   Procedure: COLONOSCOPY WITH PROPOFOL;  Surgeon: [name], MD;  Location: [facility] ENDOSCOPY;  Service: Endoscopy;  Laterality: N/A;  . EYE SURGERY    . TONSILLECTOMY  1941  . TONSILLECTOMY     Family History  Problem Relation Age of Onset  . Arthritis Mother   . Heart disease Mother   . Hypertension Mother   . Heart disease Father   . Breast cancer Paternal Aunt        64s  . Breast cancer Paternal Grandmother        great grandmother   Social History   Substance and Sexual Activity  Sexual Activity No     Outpatient Encounter Medications as of 01/11/2017  Medication Sig  . aspirin 81 MG tablet Take 81 mg by mouth daily.  . calcium carbonate (OS-CAL) 600 MG TABS tablet Take 1,200 mg by mouth daily.  . Multiple Vitamin (MULTIVITAMIN) capsule Take 1 capsule by mouth daily.  . Multiple Vitamins-Minerals (RA VISION-VITE PRESERVE PO) Take 1 tablet by mouth daily.  . Omega-3 Fatty Acids (FISH OIL) 300 MG CAPS Take 1 capsule by mouth daily.  [name] omeprazole (PRILOSEC) 20 MG capsule take 1 capsule by mouth every morning  . TURMERIC PO Take 1 capsule by mouth daily.  . [DISCONTINUED] ciprofloxacin (CIPRO) 250 MG tablet Take 1 tablet (250 mg total) by mouth 2 (two) times daily.   No facility-administered encounter medications on file as of 01/11/2017.     Activities of Daily Living In your present state of health, do you have any difficulty performing the following activities: 01/11/2017  Hearing? N  Vision? N  Difficulty concentrating or making decisions? N  Walking or climbing stairs? N  Dressing or bathing? N  Doing errands, shopping? N  Preparing Food and eating ? N  Using the Toilet? N  In the past six months, have you accidently  leaked urine? N  Do you have problems with loss of bowel control? N  Managing your Medications? N  Managing your Finances? N  Housekeeping or managing your Housekeeping? N  Some recent data might be hidden    Patient Care Team: Crecencio Mc, MD as PCP - General (Internal Medicine) Crecencio Mc, MD (Internal Medicine) Bary Castilla Forest Gleason, MD (General Surgery)    Assessment:    This is a routine wellness examination for [name]. The goal of the wellness visit is to assist the patient how to close the gaps in care and create a preventative care plan for the patient.   The roster of all physicians providing medical care to patient is listed in the Snapshot section of the chart.  Taking calcium VIT D as appropriate/Osteoporosis risk reviewed.     Safety issues reviewed; Lives [facility] at [facility].  Smoke and carbon monoxide detectors in the home. No firearms in the home.  Wears seatbelts when driving or riding with others. Patient does wear sunscreen or protective clothing when in direct sunlight. No violence in the home.  Depression- PHQ 2 &9 complete.  No signs/symptoms or verbal communication regarding little pleasure in doing things, feeling down, depressed or hopeless. No changes in sleeping, energy, eating, concentrating.  No thoughts of self harm or harm towards others.  Time spent on this topic is 8 minutes.   Patient is alert, normal appearance, oriented to person/place/and time. Correctly identified the president of the [place], recall of 2/3 words, and performing simple calculations. Displays appropriate judgement and can read correct time from watch face.   No new identified risk were noted.  No failures at ADL's or IADL's.    BMI- discussed the importance of a healthy diet, water intake and the benefits of aerobic exercise. Educational material provided.   24 hour diet recall: Breakfast: cereal Lunch: garlic chicken, bread Dinner: garlic chicken, bread  Daily fluid intake: 1 cups of caffeine, 6-8 cups of water  Dental- every 6 months.  Eye- Visual acuity not assessed per patient preference since they have regular follow up with the ophthalmologist.    Sleep patterns- Sleeps 7 hours at night.  Wakes feeling rested.  Health maintenance gaps- closed.  Fasting labs ordered, CPE scheduled.  Patient Concerns: None at this time. Follow up with PCP as needed.  Exercise Activities and Dietary recommendations Current Exercise Habits: Home exercise routine, Type of exercise: treadmill, Frequency (Times/Week): 1, Intensity: Mild  Fall Risk Fall Risk  01/11/2017 01/04/2016 12/29/2015 12/28/2014 12/22/2013  Falls in the past year? No No No Yes No  Number falls in past yr: - - - 1 -  Injury with Fall? - - - No -   Follow up - - - Education provided -   Depression Screen PHQ 2/9 Scores 01/11/2017 01/04/2016 12/29/2015 12/28/2014  PHQ - 2 Score 0 0 0 0  PHQ- 9 Score 0 - - -     Cognitive Function MMSE - Mini Mental State Exam 01/11/2017 01/04/2016  Orientation to time 5 5  Orientation to Place 5 5  Registration 3 3  Attention/ Calculation 5 5  Recall 3 3  Language- name 2 objects 2 2  Language- repeat 1 1  Language- follow 3 step command 3 3  Language- read & follow direction 1 1  Write a sentence 1 1  Copy design 1 1  Total score 30 30        Immunization History  Administered Date(s) Administered  .  Influenza,inj,Quad PF,6+ Mos 11/27/2012  . Influenza-Unspecified 12/01/2013, 12/17/2014, 12/23/2015, 12/27/2016  . Pneumococcal Conjugate-13 12/22/2013  . Pneumococcal Polysaccharide-23 11/27/2009, 12/28/2014  . Tdap 07/19/2013  . Zoster 11/28/2010   Screening Tests Health Maintenance  Topic Date Due  . INFLUENZA VACCINE  09/27/2016  . TETANUS/TDAP  07/20/2023  . DEXA SCAN  Completed  . PNA vac Low Risk Adult  Completed      Plan:    End of life planning; Advance aging; Advanced directives discussed. Copy of current HCPOA/Living Will requested.    I have personally reviewed and noted the following in the patient's chart:   . Medical and social history . Use of alcohol, tobacco or illicit drugs  . Current medications and supplements . Functional ability and status . Nutritional status . Physical activity . Advanced directives . List of other physicians . Hospitalizations, surgeries, and ER visits in previous 12 months . Vitals . Screenings to include cognitive, depression, and falls . Referrals and appointments  In addition, I have reviewed and discussed with patient certain preventive protocols, quality metrics, and best practice recommendations. A written personalized care plan for preventive services as well as general preventive health recommendations were provided  to patient.     OBrien-Blaney, Marshaun Lortie L, LPN  [id number]    I have reviewed the above information and agree with above.   Deborra Medina, MD

## 2017-01-11 NOTE — Patient Instructions (Addendum)
  Ms. Washinton , Thank you for taking time to come for your Medicare Wellness Visit. I appreciate your ongoing commitment to your health goals. Please review the following plan we discussed and let me know if I can assist you in the future.   Follow up with Dr. Derrel Nip as needed.    Bring a copy of your Sisters and/or Living Will to be scanned into chart.  Have a great day!  These are the goals we discussed:  Increase physical activity at the gym, low carb foods.   This is a list of the screening recommended for you and due dates:  Health Maintenance  Topic Date Due  . Flu Shot  09/27/2016  . Tetanus Vaccine  07/20/2023  . DEXA scan (bone density measurement)  Completed  . Pneumonia vaccines  Completed

## 2017-01-24 ENCOUNTER — Other Ambulatory Visit (INDEPENDENT_AMBULATORY_CARE_PROVIDER_SITE_OTHER): Payer: PPO

## 2017-01-24 DIAGNOSIS — E7849 Other hyperlipidemia: Secondary | ICD-10-CM

## 2017-01-24 DIAGNOSIS — R5383 Other fatigue: Secondary | ICD-10-CM | POA: Diagnosis not present

## 2017-01-24 DIAGNOSIS — E6609 Other obesity due to excess calories: Secondary | ICD-10-CM

## 2017-01-24 DIAGNOSIS — Z6834 Body mass index (BMI) 34.0-34.9, adult: Secondary | ICD-10-CM | POA: Diagnosis not present

## 2017-01-24 DIAGNOSIS — E559 Vitamin D deficiency, unspecified: Secondary | ICD-10-CM

## 2017-01-24 LAB — LIPID PANEL
CHOL/HDL RATIO: 3
Cholesterol: 224 mg/dL — ABNORMAL HIGH (ref 0–200)
HDL: 74.7 mg/dL (ref >39.00)
LDL Cholesterol: 129 mg/dL — ABNORMAL HIGH (ref 0–99)
NONHDL: 149.59
Triglycerides: 101 mg/dL (ref 0.0–149.0)
VLDL: 20.2 mg/dL (ref 0.0–40.0)

## 2017-01-24 LAB — COMPREHENSIVE METABOLIC PANEL
ALBUMIN: 3.9 g/dL (ref 3.5–5.2)
ALT: 18 U/L (ref 0–35)
AST: 19 U/L (ref 0–37)
Alkaline Phosphatase: 72 U/L (ref 39–117)
BUN: 17 mg/dL (ref 6–23)
CHLORIDE: 104 meq/L (ref 96–112)
CO2: 29 mEq/L (ref 19–32)
Calcium: 9.4 mg/dL (ref 8.4–10.5)
Creatinine, Ser: 0.65 mg/dL (ref 0.40–1.20)
GFR: 92.62 mL/min (ref >60.00)
Glucose, Bld: 101 mg/dL — ABNORMAL HIGH (ref 70–99)
POTASSIUM: 4 meq/L (ref 3.5–5.1)
SODIUM: 140 meq/L (ref 135–145)
Total Bilirubin: 0.4 mg/dL (ref 0.2–1.2)
Total Protein: 6.8 g/dL (ref 6.0–8.3)

## 2017-01-24 LAB — CBC WITH DIFFERENTIAL/PLATELET
BASOS ABS: 0.1 10*3/uL (ref 0.0–0.1)
Basophils Relative: 1 % (ref 0.0–3.0)
EOS PCT: 1.6 % (ref 0.0–5.0)
Eosinophils Absolute: 0.1 10*3/uL (ref 0.0–0.7)
HEMATOCRIT: 40.3 % (ref 36.0–46.0)
Hemoglobin: 13.2 g/dL (ref 12.0–15.0)
LYMPHS PCT: 29.6 % (ref 12.0–46.0)
Lymphs Abs: 1.7 10*3/uL (ref 0.7–4.0)
MCHC: 32.7 g/dL (ref 30.0–36.0)
MCV: 88.6 fl (ref 78.0–100.0)
MONOS PCT: 10.6 % (ref 3.0–12.0)
Monocytes Absolute: 0.6 10*3/uL (ref 0.1–1.0)
NEUTROS ABS: 3.2 10*3/uL (ref 1.4–7.7)
Neutrophils Relative %: 57.2 % (ref 43.0–77.0)
PLATELETS: 287 10*3/uL (ref 150.0–400.0)
RBC: 4.55 Mil/uL (ref 3.87–5.11)
RDW: 14.5 % (ref 11.5–15.5)
WBC: 5.6 10*3/uL (ref 4.0–10.5)

## 2017-01-24 LAB — VITAMIN D 25 HYDROXY (VIT D DEFICIENCY, FRACTURES): VITD: 25.53 ng/mL — AB (ref 30.00–100.00)

## 2017-01-24 LAB — LDL CHOLESTEROL, DIRECT: LDL DIRECT: 127 mg/dL

## 2017-01-24 LAB — TSH: TSH: 3.25 u[IU]/mL (ref 0.35–4.50)

## 2017-01-25 ENCOUNTER — Encounter: Payer: Self-pay | Admitting: Internal Medicine

## 2017-01-25 ENCOUNTER — Ambulatory Visit (INDEPENDENT_AMBULATORY_CARE_PROVIDER_SITE_OTHER): Payer: PPO | Admitting: Internal Medicine

## 2017-01-25 VITALS — BP 130/64 | HR 78 | Temp 97.7°F | Resp 14 | Ht 62.75 in | Wt 202.2 lb

## 2017-01-25 DIAGNOSIS — E6609 Other obesity due to excess calories: Secondary | ICD-10-CM

## 2017-01-25 DIAGNOSIS — Z1239 Encounter for other screening for malignant neoplasm of breast: Secondary | ICD-10-CM

## 2017-01-25 DIAGNOSIS — F5104 Psychophysiologic insomnia: Secondary | ICD-10-CM | POA: Diagnosis not present

## 2017-01-25 DIAGNOSIS — D126 Benign neoplasm of colon, unspecified: Secondary | ICD-10-CM

## 2017-01-25 DIAGNOSIS — N6002 Solitary cyst of left breast: Secondary | ICD-10-CM | POA: Diagnosis not present

## 2017-01-25 DIAGNOSIS — Z1231 Encounter for screening mammogram for malignant neoplasm of breast: Secondary | ICD-10-CM

## 2017-01-25 DIAGNOSIS — Z6833 Body mass index (BMI) 33.0-33.9, adult: Secondary | ICD-10-CM | POA: Diagnosis not present

## 2017-01-25 MED ORDER — ZOSTER VAC RECOMB ADJUVANTED 50 MCG/0.5ML IM SUSR: 0.5000 mL | Freq: Once | INTRAMUSCULAR | 1 refills | Status: AC

## 2017-01-25 NOTE — Patient Instructions (Addendum)
I AGREE THAT LOSING 20 LBS IS A GREAT GOAL.  Reducing your carbohydrates by limiting your intake of starches is a great way to do that    [employer] Cl[employer] now makes a frozen breakfast frittata AND an "egg sandwhich"  that can be microwaved in 2 minutes and are very low carb. Niel Hummer are similar to quiches, but  without the crust)    The ShingRx vaccine is now available in local pharmacies and is much more protective thant Zostavaxs,  It is therefore ADVISED for all interested adults over 50 to prevent shingles    Health Maintenance for Postmenopausal Women Menopause is a normal process in which your reproductive ability comes to an end. This process happens gradually over a span of months to years, usually between the ages of 30 and 11. Menopause is complete when you have missed 12 consecutive menstrual periods. It is important to talk with your health care provider about some of the most common conditions that affect postmenopausal women, such as heart disease, cancer, and bone loss (osteoporosis). Adopting a healthy lifestyle and getting preventive care can help to promote your health and wellness. Those actions can also lower your chances of developing some of these common conditions. What should I know about menopause? During menopause, you may experience a number of symptoms, such as:  Moderate-to-severe hot flashes.  Night sweats.  Decrease in sex drive.  Mood swings.  Headaches.  Tiredness.  Irritability.  Memory problems.  Insomnia.  Choosing to treat or not to treat menopausal changes is an individual decision that you make with your health care provider. What should I know about hormone replacement therapy and supplements? Hormone therapy products are effective for treating symptoms that are associated with menopause, such as hot flashes and night sweats. Hormone replacement carries certain risks, especially as you become older. If you are thinking about using estrogen  or estrogen with progestin treatments, discuss the benefits and risks with your health care provider. What should I know about heart disease and stroke? Heart disease, heart attack, and stroke become more likely as you age. This may be due, in part, to the hormonal changes that your body experiences during menopause. These can affect how your body processes dietary fats, triglycerides, and cholesterol. Heart attack and stroke are both medical emergencies. There are many things that you can do to help prevent heart disease and stroke:  Have your blood pressure checked at least every 1-2 years. High blood pressure causes heart disease and increases the risk of stroke.  If you are 41-30 years old, ask your health care provider if you should take aspirin to prevent a heart attack or a stroke.  Do not use any tobacco products, including cigarettes, chewing tobacco, or electronic cigarettes. If you need help quitting, ask your health care provider.  It is important to eat a healthy diet and maintain a healthy weight. ? Be sure to include plenty of vegetables, fruits, low-fat dairy products, and lean protein. ? Avoid eating foods that are high in solid fats, added sugars, or salt (sodium).  Get regular exercise. This is one of the most important things that you can do for your health. ? Try to exercise for at least 150 minutes each week. The type of exercise that you do should increase your heart rate and make you sweat. This is known as moderate-intensity exercise. ? Try to do strengthening exercises at least twice each week. Do these in addition to the moderate-intensity exercise.  Know your  numbers.Ask your health care provider to check your cholesterol and your blood glucose. Continue to have your blood tested as directed by your health care provider.  What should I know about cancer screening? There are several types of cancer. Take the following steps to reduce your risk and to catch any cancer  development as early as possible. Breast Cancer  Practice breast self-awareness. ? This means understanding how your breasts normally appear and feel. ? It also means doing regular breast self-exams. Let your health care provider know about any changes, no matter how small.  If you are 34 or older, have a clinician do a breast exam (clinical breast exam or CBE) every year. Depending on your age, family history, and medical history, it may be recommended that you also have a yearly breast X-ray (mammogram).  If you have a family history of breast cancer, talk with your health care provider about genetic screening.  If you are at high risk for breast cancer, talk with your health care provider about having an MRI and a mammogram every year.  Breast cancer (BRCA) gene test is recommended for women who have family members with BRCA-related cancers. Results of the assessment will determine the need for genetic counseling and BRCA1 and for BRCA2 testing. BRCA-related cancers include these types: ? Breast. This occurs in males or females. ? Ovarian. ? Tubal. This may also be called fallopian tube cancer. ? Cancer of the abdominal or pelvic lining (peritoneal cancer). ? Prostate. ? Pancreatic.  Cervical, Uterine, and Ovarian Cancer Your health care provider may recommend that you be screened regularly for cancer of the pelvic organs. These include your ovaries, uterus, and vagina. This screening involves a pelvic exam, which includes checking for microscopic changes to the surface of your cervix (Pap test).  For women ages 21-65, health care providers may recommend a pelvic exam and a Pap test every three years. For women ages 48-65, they may recommend the Pap test and pelvic exam, combined with testing for human papilloma virus (HPV), every five years. Some types of HPV increase your risk of cervical cancer. Testing for HPV may also be done on women of any age who have unclear Pap test  results.  Other health care providers may not recommend any screening for nonpregnant women who are considered low risk for pelvic cancer and have no symptoms. Ask your health care provider if a screening pelvic exam is right for you.  If you have had past treatment for cervical cancer or a condition that could lead to cancer, you need Pap tests and screening for cancer for at least 20 years after your treatment. If Pap tests have been discontinued for you, your risk factors (such as having a new sexual partner) need to be reassessed to determine if you should start having screenings again. Some women have medical problems that increase the chance of getting cervical cancer. In these cases, your health care provider may recommend that you have screening and Pap tests more often.  If you have a family history of uterine cancer or ovarian cancer, talk with your health care provider about genetic screening.  If you have vaginal bleeding after reaching menopause, tell your health care provider.  There are currently no reliable tests available to screen for ovarian cancer.  Lung Cancer Lung cancer screening is recommended for adults 68-74 years old who are at high risk for lung cancer because of a history of smoking. A yearly low-dose CT scan of the lungs is  recommended if you:  Currently smoke.  Have a history of at least 30 pack-years of smoking and you currently smoke or have quit within the past 15 years. A pack-year is smoking an average of one pack of cigarettes per day for one year.  Yearly screening should:  Continue until it has been 15 years since you quit.  Stop if you develop a health problem that would prevent you from having lung cancer treatment.  Colorectal Cancer  This type of cancer can be detected and can often be prevented.  Routine colorectal cancer screening usually begins at age 65 and continues through age 48.  If you have risk factors for colon cancer, your health  care provider may recommend that you be screened at an earlier age.  If you have a family history of colorectal cancer, talk with your health care provider about genetic screening.  Your health care provider may also recommend using home test kits to check for hidden blood in your stool.  A small camera at the end of a tube can be used to examine your colon directly (sigmoidoscopy or colonoscopy). This is done to check for the earliest forms of colorectal cancer.  Direct examination of the colon should be repeated every 5-10 years until age 10. However, if early forms of precancerous polyps or small growths are found or if you have a family history or genetic risk for colorectal cancer, you may need to be screened more often.  Skin Cancer  Check your skin from head to toe regularly.  Monitor any moles. Be sure to tell your health care provider: ? About any new moles or changes in moles, especially if there is a change in a mole's shape or color. ? If you have a mole that is larger than the size of a pencil eraser.  If any of your family members has a history of skin cancer, especially at a young age, talk with your health care provider about genetic screening.  Always use sunscreen. Apply sunscreen liberally and repeatedly throughout the day.  Whenever you are outside, protect yourself by wearing long sleeves, pants, a wide-brimmed hat, and sunglasses.  What should I know about osteoporosis? Osteoporosis is a condition in which bone destruction happens more quickly than new bone creation. After menopause, you may be at an increased risk for osteoporosis. To help prevent osteoporosis or the bone fractures that can happen because of osteoporosis, the following is recommended:  If you are 67-31 years old, get at least 1,000 mg of calcium and at least 600 mg of vitamin D per day.  If you are older than age 68 but younger than age 79, get at least 1,200 mg of calcium and at least 600 mg of  vitamin D per day.  If you are older than age 25, get at least 1,200 mg of calcium and at least 800 mg of vitamin D per day.  Smoking and excessive alcohol intake increase the risk of osteoporosis. Eat foods that are rich in calcium and vitamin D, and do weight-bearing exercises several times each week as directed by your health care provider. What should I know about how menopause affects my mental health? Depression may occur at any age, but it is more common as you become older. Common symptoms of depression include:  Low or sad mood.  Changes in sleep patterns.  Changes in appetite or eating patterns.  Feeling an overall lack of motivation or enjoyment of activities that you previously enjoyed.  Frequent crying spells.  Talk with your health care provider if you think that you are experiencing depression. What should I know about immunizations? It is important that you get and maintain your immunizations. These include:  Tetanus, diphtheria, and pertussis (Tdap) booster vaccine.  Influenza every year before the flu season begins.  Pneumonia vaccine.  Shingles vaccine.  Your health care provider may also recommend other immunizations. This information is not intended to replace advice given to you by your health care provider. Make sure you discuss any questions you have with your health care provider. Document Released: 04/07/2005 Document Revised: 09/03/2015 Document Reviewed: 11/17/2014 Elsevier Interactive Patient Education  2018 [employer].

## 2017-01-25 NOTE — Progress Notes (Signed)
Patient ID: Brianna Perry, female    DOB: 08-10-1932  Age: 83 y.o. MRN: [id number]  The patient is here for  FOLLOW UP AND  management of  chronic and acute problems.  Diagnostic mammogram and US guided aspiration of left breast cyst Jan 2018 [facility]): benign  Diagnostic Colonoscopy 2017:  2 5 mm polyps found ([place])  Tubular adenoma , 5 yr follow up should be considered pending patient's health status at the anticipated age of 37 DEXA 2016 : T Score  -1.1    The risk factors are reflected in the social history.  The roster of all physicians providing medical care to patient - is listed in the Snapshot section of the chart.  Activities of daily living:  The patient is 100% independent in all ADLs: dressing, toileting, feeding as well as independent mobility  Home safety : The patient has smoke detectors in the home. They wear seatbelts.  There are no firearms at home. There is no violence in the home.   There is no risks for hepatitis, STDs or HIV. There is no   history of blood transfusion. They have no travel history to infectious disease endemic areas of the world.  The patient has seen their dentist in the last six month. They have seen their eye doctor in the last year.   They do not  have excessive sun exposure. Discussed the need for sun protection: hats, long sleeves and use of sunscreen if there is significant sun exposure.   Diet: the importance of a healthy diet is discussed. They do have a healthy diet.  The benefits of regular aerobic exercise were discussed. She walks 4 times per week ,  20 minutes.   Depression screen: there are no signs or vegative symptoms of depression- irritability, change in appetite, anhedonia, sadness/tearfullness.  Cognitive assessment: the patient manages all their financial and personal affairs and is actively engaged. They could relate day,date,year and events; recalled 2/3 objects at 3 minutes; performed clock-face test normally.  The  following portions of the patient's history were reviewed and updated as appropriate: allergies, current medications, past family history, past medical history,  past surgical history, past social history  and problem list.  Visual acuity was not assessed per patient preference since she has regular follow up with her ophthalmologist. Hearing and body mass index were assessed and reviewed.   During the course of the visit the patient was educated and counseled about appropriate screening and preventive services including : fall prevention , diabetes screening, nutrition counseling, colorectal cancer screening, and recommended immunizations.    CC: The primary encounter diagnosis was Breast cancer screening. Diagnoses of Simple cyst of breast, left, Tubular adenoma of colon, Chronic insomnia, and Class 1 obesity due to excess calories without serious comorbidity with body mass index (BMI) of 33.0 to 33.9 in adult were also pertinent to this visit.  History Jahyra has a past medical history of Arthritis, Breast cancer ([place]) (2010), Breast mass (x couple months), Cancer ([place]) (2010), Chickenpox, GERD (gastroesophageal reflux disease), and Urine incontinence.   She has a past surgical history that includes Abdominal hysterectomy (1968); Tonsillectomy (1941); Colonoscopy; Eye surgery; Tonsillectomy; Colonoscopy with propofol (N/A, 03/23/2015); Breast surgery (Right, 2010); Breast cyst aspiration (Left, 2015); and Augmentation mammaplasty (Bilateral, 1973).   Her family history includes Arthritis in her mother; Breast cancer in her paternal aunt and paternal grandmother; Heart disease in her father and mother; Hypertension in her mother.She reports that  has never smoked. she has  never used smokeless tobacco. She reports that she does not drink alcohol or use drugs.  Outpatient Medications Prior to Visit  Medication Sig Dispense Refill  . aspirin 81 MG tablet Take 81 mg by mouth daily.    . calcium  carbonate (OS-CAL) 600 MG TABS tablet Take 1,200 mg by mouth daily.    . Multiple Vitamin (MULTIVITAMIN) capsule Take 1 capsule by mouth daily.    . Multiple Vitamins-Minerals (RA VISION-VITE PRESERVE PO) Take 1 tablet by mouth daily.    . Omega-3 Fatty Acids (FISH OIL) 300 MG CAPS Take 1 capsule by mouth daily.    [name] omeprazole (PRILOSEC) 20 MG capsule take 1 capsule by mouth every morning 90 capsule 1  . TURMERIC PO Take 1 capsule by mouth daily.     No facility-administered medications prior to visit.     Review of Systems   Patient denies headache, fevers, malaise, unintentional weight loss, skin rash, eye pain, sinus congestion and sinus pain, sore throat, dysphagia,  hemoptysis , cough, dyspnea, wheezing, chest pain, palpitations, orthopnea, edema, abdominal pain, nausea, melena, diarrhea, constipation, flank pain, dysuria, hematuria, urinary  Frequency, nocturia, numbness, tingling, seizures,  Focal weakness, Loss of consciousness,  Tremor, insomnia, depression, anxiety, and suicidal ideation.      Objective:  BP 130/64 (BP Location: Left Arm, Patient Position: Sitting, Cuff Size: Normal)   Pulse 78   Temp 97.7 F (36.5 C) (Oral)   Resp 14   Ht 5' 2.75" (1.594 m)   Wt 202 lb 3.2 oz (91.7 kg)   SpO2 96%   BMI 36.10 kg/m   Physical Exam  General appearance: alert, cooperative and appears stated age Head: Normocephalic, without obvious abnormality, atraumatic Eyes: conjunctivae/corneas clear. PERRL, EOM's intact. Fundi benign. Ears: normal TM's and external ear canals both ears Nose: Nares normal. Septum midline. Mucosa normal. No drainage or sinus tenderness. Throat: lips, mucosa, and tongue normal; teeth and gums normal Neck: no adenopathy, no carotid bruit, no JVD, supple, symmetrical, trachea midline and thyroid not enlarged, symmetric, no tenderness/mass/nodules Lungs: clear to auscultation bilaterally Breasts: normal appearance, no masses or tenderness Heart: regular  rate and rhythm, S1, S2 normal, no murmur, click, rub or gallop Abdomen: soft, non-tender; bowel sounds normal; no masses,  no organomegaly Extremities: extremities normal, atraumatic, no cyanosis or edema Pulses: 2+ and symmetric Skin: Skin color, texture, turgor normal. No rashes or lesions Neurologic: Alert and oriented X 3, normal strength and tone. Normal symmetric reflexes. Normal coordination and gait.     Assessment & Plan:   Problem List Items Addressed This Visit    Chronic insomnia    She remains successfully weaned from  all hypnotics      Obesity    I have addressed  BMI and recommended wt loss of 10% of body weight over the next 6 months using a low glycemic index diet and regular exercise a minimum of 5 days per week.        Simple cyst of breast, left    By FNA Jan 2018  One year follow up screening due in Jan 2018      Tubular adenoma of colon    By diagnostic colonoscopy at age 31  5 yr follow up advised if health permits.        Other Visit Diagnoses    Breast cancer screening    -  Primary   Relevant Orders   MM Digital Diagnostic Bilat     A total of 40 minutes  was spent with patient more than half of which was spent in counseling patient on the above mentioned issues , reviewing and explaining recent labs and imaging studies done, and coordination of care.   I am having Efrat Zuidema. Bircher start on Zoster Vaccine Adjuvanted. I am also having her maintain her multivitamin, FISH OIL, calcium carbonate, aspirin, Multiple Vitamins-Minerals (RA VISION-VITE PRESERVE PO), TURMERIC PO, and omeprazole.  Meds ordered this encounter  Medications  . Zoster Vaccine Adjuvanted [facility]) injection    Sig: Inject 0.5 mLs into the muscle once for 1 dose.    Dispense:  1 each    Refill:  1    There are no discontinued medications.  Follow-up: No Follow-up on file.   Crecencio Mc, MD

## 2017-01-28 ENCOUNTER — Encounter: Payer: Self-pay | Admitting: Internal Medicine

## 2017-01-28 NOTE — Assessment & Plan Note (Signed)
I have addressed  BMI and recommended wt loss of 10% of body weight over the next 6 months using a low glycemic index diet and regular exercise a minimum of 5 days per week.   

## 2017-01-28 NOTE — Assessment & Plan Note (Signed)
By FNA Jan 2018  One year follow up screening due in Jan 2018

## 2017-01-28 NOTE — Assessment & Plan Note (Signed)
By diagnostic colonoscopy at age 83  5 yr follow up advised if health permits.

## 2017-01-28 NOTE — Assessment & Plan Note (Addendum)
She remains successfully weaned from  all hypnotics

## 2017-01-30 ENCOUNTER — Telehealth: Payer: Self-pay | Admitting: Internal Medicine

## 2017-01-30 DIAGNOSIS — Z1239 Encounter for other screening for malignant neoplasm of breast: Secondary | ICD-10-CM

## 2017-01-30 NOTE — Telephone Encounter (Signed)
Order needed to read [facility] 3D IMG [id number] and [place] IMG 5531 Left and 5532 Right. Please and Thank you!

## 2017-01-31 NOTE — Telephone Encounter (Signed)
Orders have been placed.

## 2017-02-22 ENCOUNTER — Telehealth: Payer: Self-pay | Admitting: Internal Medicine

## 2017-02-22 DIAGNOSIS — H6123 Impacted cerumen, bilateral: Secondary | ICD-10-CM

## 2017-02-22 DIAGNOSIS — H6120 Impacted cerumen, unspecified ear: Secondary | ICD-10-CM | POA: Insufficient documentation

## 2017-02-22 NOTE — Assessment & Plan Note (Signed)
Needs irrigation .  rn visit requested

## 2017-02-22 NOTE — Telephone Encounter (Signed)
During daughter's visit she requested era exam and has bilateral impaction .  Can you set her up for nurse irrigation ?

## 2017-02-28 ENCOUNTER — Telehealth: Payer: Self-pay | Admitting: Internal Medicine

## 2017-02-28 NOTE — Telephone Encounter (Signed)
Patient's urine tests from September were denied payment by insurance, likely bc they were done in a nurse visit that was not addended by me.  I have addended the nurse visit note from setp 18,  canthe charges be resubmitted?

## 2017-03-07 NOTE — Telephone Encounter (Signed)
Pt has already been scheduled for January 29th by front desk

## 2017-03-09 NOTE — Telephone Encounter (Signed)
I do not know the answer to your question,  And I would like to be taken out of the middle of it.  I will forward your response to patient.

## 2017-03-13 ENCOUNTER — Ambulatory Visit
Admission: RE | Admit: 2017-03-13 | Discharge: 2017-03-13 | Disposition: A | Discharge status: Home / Self Care | Payer: PPO | Source: Ambulatory Visit | Attending: Internal Medicine | Admitting: Internal Medicine

## 2017-03-13 DIAGNOSIS — Z1239 Encounter for other screening for malignant neoplasm of breast: Secondary | ICD-10-CM

## 2017-03-13 DIAGNOSIS — R928 Other abnormal and inconclusive findings on diagnostic imaging of breast: Secondary | ICD-10-CM | POA: Diagnosis not present

## 2017-03-13 DIAGNOSIS — Z9882 Breast implant status: Secondary | ICD-10-CM | POA: Diagnosis not present

## 2017-03-13 DIAGNOSIS — Z1231 Encounter for screening mammogram for malignant neoplasm of breast: Secondary | ICD-10-CM | POA: Insufficient documentation

## 2017-03-13 DIAGNOSIS — Z853 Personal history of malignant neoplasm of breast: Secondary | ICD-10-CM | POA: Diagnosis not present

## 2017-03-13 DIAGNOSIS — N6002 Solitary cyst of left breast: Secondary | ICD-10-CM | POA: Diagnosis not present

## 2017-03-27 ENCOUNTER — Ambulatory Visit: Payer: PPO

## 2017-03-27 ENCOUNTER — Encounter: Payer: PPO | Admitting: Internal Medicine

## 2017-04-10 ENCOUNTER — Ambulatory Visit (INDEPENDENT_AMBULATORY_CARE_PROVIDER_SITE_OTHER): Payer: PPO | Admitting: *Deleted

## 2017-04-10 DIAGNOSIS — H6123 Impacted cerumen, bilateral: Secondary | ICD-10-CM | POA: Diagnosis not present

## 2017-04-10 NOTE — Progress Notes (Signed)
Patient came into office for bilateral ear irrigation per last OV and telephone note in chart bilateral impaction. Large amount of cerumen removed from bilateral ears .

## 2017-04-15 NOTE — Progress Notes (Signed)
  I have reviewed the above information and agree with above.   Marvetta Vohs, MD 

## 2017-04-17 ENCOUNTER — Encounter: Payer: Self-pay | Admitting: Internal Medicine

## 2017-04-18 MED ORDER — OMEPRAZOLE 20 MG PO CPDR: 20.0000 mg | DELAYED_RELEASE_CAPSULE | Freq: Every morning | ORAL | 1 refills | Status: DC

## 2017-04-24 ENCOUNTER — Encounter: Payer: Self-pay | Admitting: Internal Medicine

## 2017-04-24 NOTE — Telephone Encounter (Signed)
I spoke to the [facility] ref# [id number] . The patient's claim was paid she does not owe anything for her mammogram under the procedure code [id number].

## 2017-07-18 ENCOUNTER — Encounter: Payer: Self-pay | Admitting: Internal Medicine

## 2017-08-15 ENCOUNTER — Ambulatory Visit (INDEPENDENT_AMBULATORY_CARE_PROVIDER_SITE_OTHER): Payer: PPO | Admitting: Internal Medicine

## 2017-08-15 VITALS — BP 140/80 | HR 72 | Temp 98.3°F | Resp 18 | Wt 203.8 lb

## 2017-08-15 DIAGNOSIS — Z79899 Other long term (current) drug therapy: Secondary | ICD-10-CM

## 2017-08-15 DIAGNOSIS — M79672 Pain in left foot: Secondary | ICD-10-CM

## 2017-08-15 LAB — COMPREHENSIVE METABOLIC PANEL
ALT: 16 U/L (ref 0–35)
AST: 14 U/L (ref 0–37)
Albumin: 4 g/dL (ref 3.5–5.2)
Alkaline Phosphatase: 74 U/L (ref 39–117)
BILIRUBIN TOTAL: 0.3 mg/dL (ref 0.2–1.2)
BUN: 12 mg/dL (ref 6–23)
CALCIUM: 9.5 mg/dL (ref 8.4–10.5)
CHLORIDE: 105 meq/L (ref 96–112)
CO2: 29 meq/L (ref 19–32)
CREATININE: 0.73 mg/dL (ref 0.40–1.20)
GFR: 80.9 mL/min (ref >60.00)
Glucose, Bld: 120 mg/dL — ABNORMAL HIGH (ref 70–99)
Potassium: 4.1 mEq/L (ref 3.5–5.1)
SODIUM: 140 meq/L (ref 135–145)
Total Protein: 7.5 g/dL (ref 6.0–8.3)

## 2017-08-15 LAB — SEDIMENTATION RATE: Sed Rate: 25 mm/hr (ref 0–30)

## 2017-08-15 MED ORDER — MELOXICAM 15 MG PO TABS: 15.0000 mg | ORAL_TABLET | Freq: Every day | ORAL | 0 refills | Status: DC

## 2017-08-15 NOTE — Patient Instructions (Signed)
I think your pain may be coming from plantar fasciitis.  I am prescribing meloxicam as your anti inflammatory,  To take once daily.  Do not add motrin or advil or Aleve  You may add tylenol up to 2000  Mg daily in divided doses   Wear the boot at night.  If no better in 2 weeks  Keep the podiatry appointment.   Plantar Fasciitis Plantar fasciitis is a painful foot condition that affects the heel. It occurs when the band of tissue that connects the toes to the heel bone (plantar fascia) becomes irritated. This can happen after exercising too much or doing other repetitive activities (overuse injury). The pain from plantar fasciitis can range from mild irritation to severe pain that makes it difficult for you to walk or move. The pain is usually worse in the morning or after you have been sitting or lying down for a while. What are the causes? This condition may be caused by:  Standing for long periods of time.  Wearing shoes that do not fit.  Doing high-impact activities, including running, aerobics, and ballet.  Being overweight.  Having an abnormal way of walking (gait).  Having tight calf muscles.  Having high arches in your feet.  Starting a new athletic activity.  What are the signs or symptoms? The main symptom of this condition is heel pain. Other symptoms include:  Pain that gets worse after activity or exercise.  Pain that is worse in the morning or after resting.  Pain that goes away after you walk for a few minutes.  How is this diagnosed? This condition may be diagnosed based on your signs and symptoms. Your health care provider will also do a physical exam to check for:  A tender area on the bottom of your foot.  A high arch in your foot.  Pain when you move your foot.  Difficulty moving your foot.  You may also need to have imaging studies to confirm the diagnosis. These can include:  X-rays.  Ultrasound.  MRI.  How is this treated? Treatment  for plantar fasciitis depends on the severity of the condition. Your treatment may include:  Rest, ice, and over-the-counter pain medicines to manage your pain.  Exercises to stretch your calves and your plantar fascia.  A splint that holds your foot in a stretched, upward position while you sleep (night splint).  Physical therapy to relieve symptoms and prevent problems in the future.  Cortisone injections to relieve severe pain.  Extracorporeal shock wave therapy (ESWT) to stimulate damaged plantar fascia with electrical impulses. It is often used as a last resort before surgery.  Surgery, if other treatments have not worked after 12 months.  Follow these instructions at home:  Take medicines only as directed by your health care provider.  Avoid activities that cause pain.  Roll the bottom of your foot over a bag of ice or a bottle of cold water. Do this for 20 minutes, 3-4 times a day.  Perform simple stretches as directed by your health care provider.  Try wearing athletic shoes with air-sole or gel-sole cushions or soft shoe inserts.  Wear a night splint while sleeping, if directed by your health care provider.  Keep all follow-up appointments with your health care provider. How is this prevented?  Do not perform exercises or activities that cause heel pain.  Consider finding low-impact activities if you continue to have problems.  Lose weight if you need to. The best way to prevent  plantar fasciitis is to avoid the activities that aggravate your plantar fascia. Contact a health care provider if:  Your symptoms do not go away after treatment with home care measures.  Your pain gets worse.  Your pain affects your ability to move or do your daily activities. This information is not intended to replace advice given to you by your health care provider. Make sure you discuss any questions you have with your health care provider. Document Released: 11/08/2000 Document  Revised: 07/19/2015 Document Reviewed: 12/24/2013 Elsevier Interactive Patient Education  [employer].

## 2017-08-15 NOTE — Progress Notes (Signed)
Subjective:  Patient ID: Brianna Perry, female    DOB: 1933-01-21  Age: 84 y.o. MRN: [id number]  CC: The primary encounter diagnosis was Long-term use of high-risk medication. Diagnoses of Foot pain, left and Left foot pain were also pertinent to this visit.  HPI Brianna Perry presents for evaluation of left foot pain that has been present for the last 1.5 weeks.  The pain is localized to the plantar surface of her midfoot and forefoot . Worse when she she first stands up in the morning. And improves after she has been moving around.  The pain is aggravated and recurrent  whenever she rests . She has no history of gout,  No recent or history of fall, unusual activity, or recent change in shoes or medication.   Years ago was treated for plantar fasciitis involving the heel of same foot.   Outpatient Medications Prior to Visit  Medication Sig Dispense Refill  . aspirin 81 MG tablet Take 81 mg by mouth daily.    . calcium carbonate (OS-CAL) 600 MG TABS tablet Take 1,200 mg by mouth daily.    . Multiple Vitamin (MULTIVITAMIN) capsule Take 1 capsule by mouth daily.    . Multiple Vitamins-Minerals (RA VISION-VITE PRESERVE PO) Take 1 tablet by mouth daily.    . Omega-3 Fatty Acids (FISH OIL) 300 MG CAPS Take 1 capsule by mouth daily.    [name] omeprazole (PRILOSEC) 20 MG capsule Take 1 capsule (20 mg total) by mouth every morning. 90 capsule 1  . TURMERIC PO Take 1 capsule by mouth daily.     No facility-administered medications prior to visit.     Review of Systems;  Patient denies headache, fevers, malaise, unintentional weight loss, skin rash, eye pain, sinus congestion and sinus pain, sore throat, dysphagia,  hemoptysis , cough, dyspnea, wheezing, chest pain, palpitations, orthopnea, edema, abdominal pain, nausea, melena, diarrhea, constipation, flank pain, dysuria, hematuria, urinary  Frequency, nocturia, numbness, tingling, seizures,  Focal weakness, Loss of consciousness,  Tremor, insomnia,  depression, anxiety, and suicidal ideation.      Objective:  BP 140/80 (BP Location: Left Arm, Patient Position: Sitting, Cuff Size: Normal)   Pulse 72   Temp 98.3 F (36.8 C) (Oral)   Resp 18   Wt 203 lb 12.8 oz (92.4 kg)   SpO2 98%   BMI 36.39 kg/m   BP Readings from Last 3 Encounters:  08/15/17 140/80  01/25/17 130/64  01/11/17 126/72    Wt Readings from Last 3 Encounters:  08/15/17 203 lb 12.8 oz (92.4 kg)  01/25/17 202 lb 3.2 oz (91.7 kg)  01/11/17 200 lb 12.8 oz (91.1 kg)    General appearance: alert, cooperative and appears stated age Back: symmetric, no curvature. ROM normal. No CVA tenderness. Lungs: clear to auscultation bilaterally Heart: regular rate and rhythm, S1, S2 normal, no murmur, click, rub or gallop Abdomen: soft, non-tender; bowel sounds normal; no masses,  no organomegaly Pulses: 2+ and symmetric Skin: Skin color, texture, turgor normal. No rashes or lesions Lymph nodes: Cervical, supraclavicular, and axillary nodes normal. MSK: left foot without trauma, redness or swelling.  Pain not reproducible with metatarsal squeeze. No joint tenderness or warmth  No results found for: HGBA1C  Lab Results  Component Value Date   CREATININE 0.73 08/15/2017   CREATININE 0.65 01/24/2017   CREATININE 0.58 12/29/2015    Lab Results  Component Value Date   WBC 5.6 01/24/2017   HGB 13.2 01/24/2017   HCT 40.3 01/24/2017  PLT 287.0 01/24/2017   GLUCOSE 120 (H) 08/15/2017   CHOL 224 (H) 01/24/2017   TRIG 101.0 01/24/2017   HDL 74.70 01/24/2017   LDLDIRECT 127.0 01/24/2017   LDLCALC 129 (H) 01/24/2017   ALT 16 08/15/2017   AST 14 08/15/2017   NA 140 08/15/2017   K 4.1 08/15/2017   CL 105 08/15/2017   CREATININE 0.73 08/15/2017   BUN 12 08/15/2017   CO2 29 08/15/2017   TSH 3.25 01/24/2017    Mm Diag Breast W/implant Tomo Bilateral  Result Date: 03/13/2017 CLINICAL DATA:  84 year old female with history of right breast cancer and lumpectomy in  2005. Left breast cyst aspiration one year ago. EXAM: 2D DIGITAL DIAGNOSTIC BILATERAL MAMMOGRAM WITH IMPLANTS, CAD AND ADJUNCT TOMO The patient has retroglandular implants. Standard and implant displaced views were performed. COMPARISON:  Previous exam(s). ACR Breast Density Category a: The breast tissue is almost entirely fatty. FINDINGS: 2D and 3D full field views of both breast demonstrate no suspicious mass, nonsurgical distortion or worrisome calcifications. The 5 mm left breast mass identified on the previous study is no longer present. Right breast lumpectomy changes are again noted. Mammographic images were processed with CAD. IMPRESSION: No mammographic evidence of breast malignancy. RECOMMENDATION: Bilateral screening mammograms in 1 year as clinically indicated. I have discussed the findings and recommendations with the patient. Results were also provided in writing at the conclusion of the visit. If applicable, a reminder letter will be sent to the patient regarding the next appointment. BI-RADS CATEGORY  2: Benign. Electronically Signed   By: Margarette Canada M.D.   On: 03/13/2017 11:43    Assessment & Plan:   Problem List Items Addressed This Visit    Long-term use of high-risk medication - Primary   Relevant Orders   Comprehensive metabolic panel (Completed)   Left foot pain    Exam and history suggestive of plantar fasciitis.  Trial of anti inflammatories and foot brace at night.  podiatry referral made        Other Visit Diagnoses    Foot pain, left       Relevant Orders   Sedimentation rate (Completed)   Ambulatory referral to Podiatry      I am having [name] start on meloxicam. I am also having her maintain her multivitamin, FISH OIL, calcium carbonate, aspirin, Multiple Vitamins-Minerals (RA VISION-VITE PRESERVE PO), TURMERIC PO, and omeprazole.  Meds ordered this encounter  Medications  . meloxicam (MOBIC) 15 MG tablet    Sig: Take 1 tablet (15 mg total) by mouth  daily.    Dispense:  30 tablet    Refill:  0    There are no discontinued medications.  Follow-up: No follow-ups on file.   Crecencio Mc, MD

## 2017-08-16 ENCOUNTER — Encounter: Payer: Self-pay | Admitting: Internal Medicine

## 2017-08-16 DIAGNOSIS — M79672 Pain in left foot: Secondary | ICD-10-CM | POA: Insufficient documentation

## 2017-08-16 NOTE — Assessment & Plan Note (Addendum)
Exam and history suggestive of plantar fasciitis.  Trial of anti inflammatories and foot brace at night.  podiatry referral made .  Lab Results  Component Value Date   ESRSEDRATE 25 08/15/2017   Lab Results  Component Value Date   CREATININE 0.73 08/15/2017

## 2017-08-20 ENCOUNTER — Encounter: Payer: Self-pay | Admitting: Internal Medicine

## 2017-09-12 ENCOUNTER — Other Ambulatory Visit: Payer: Self-pay | Admitting: Internal Medicine

## 2017-10-30 NOTE — Telephone Encounter (Signed)
Pt was seen been by Philis Nettle, NP.

## 2018-01-08 IMAGING — DX DG CHEST 2V
2 series · 2 of 2 positions shown · non-contrast
Comparison: None in PACs

CLINICAL DATA: Persistent non of productive cough

EXAM:
CHEST  2 VIEW

[chest pa]
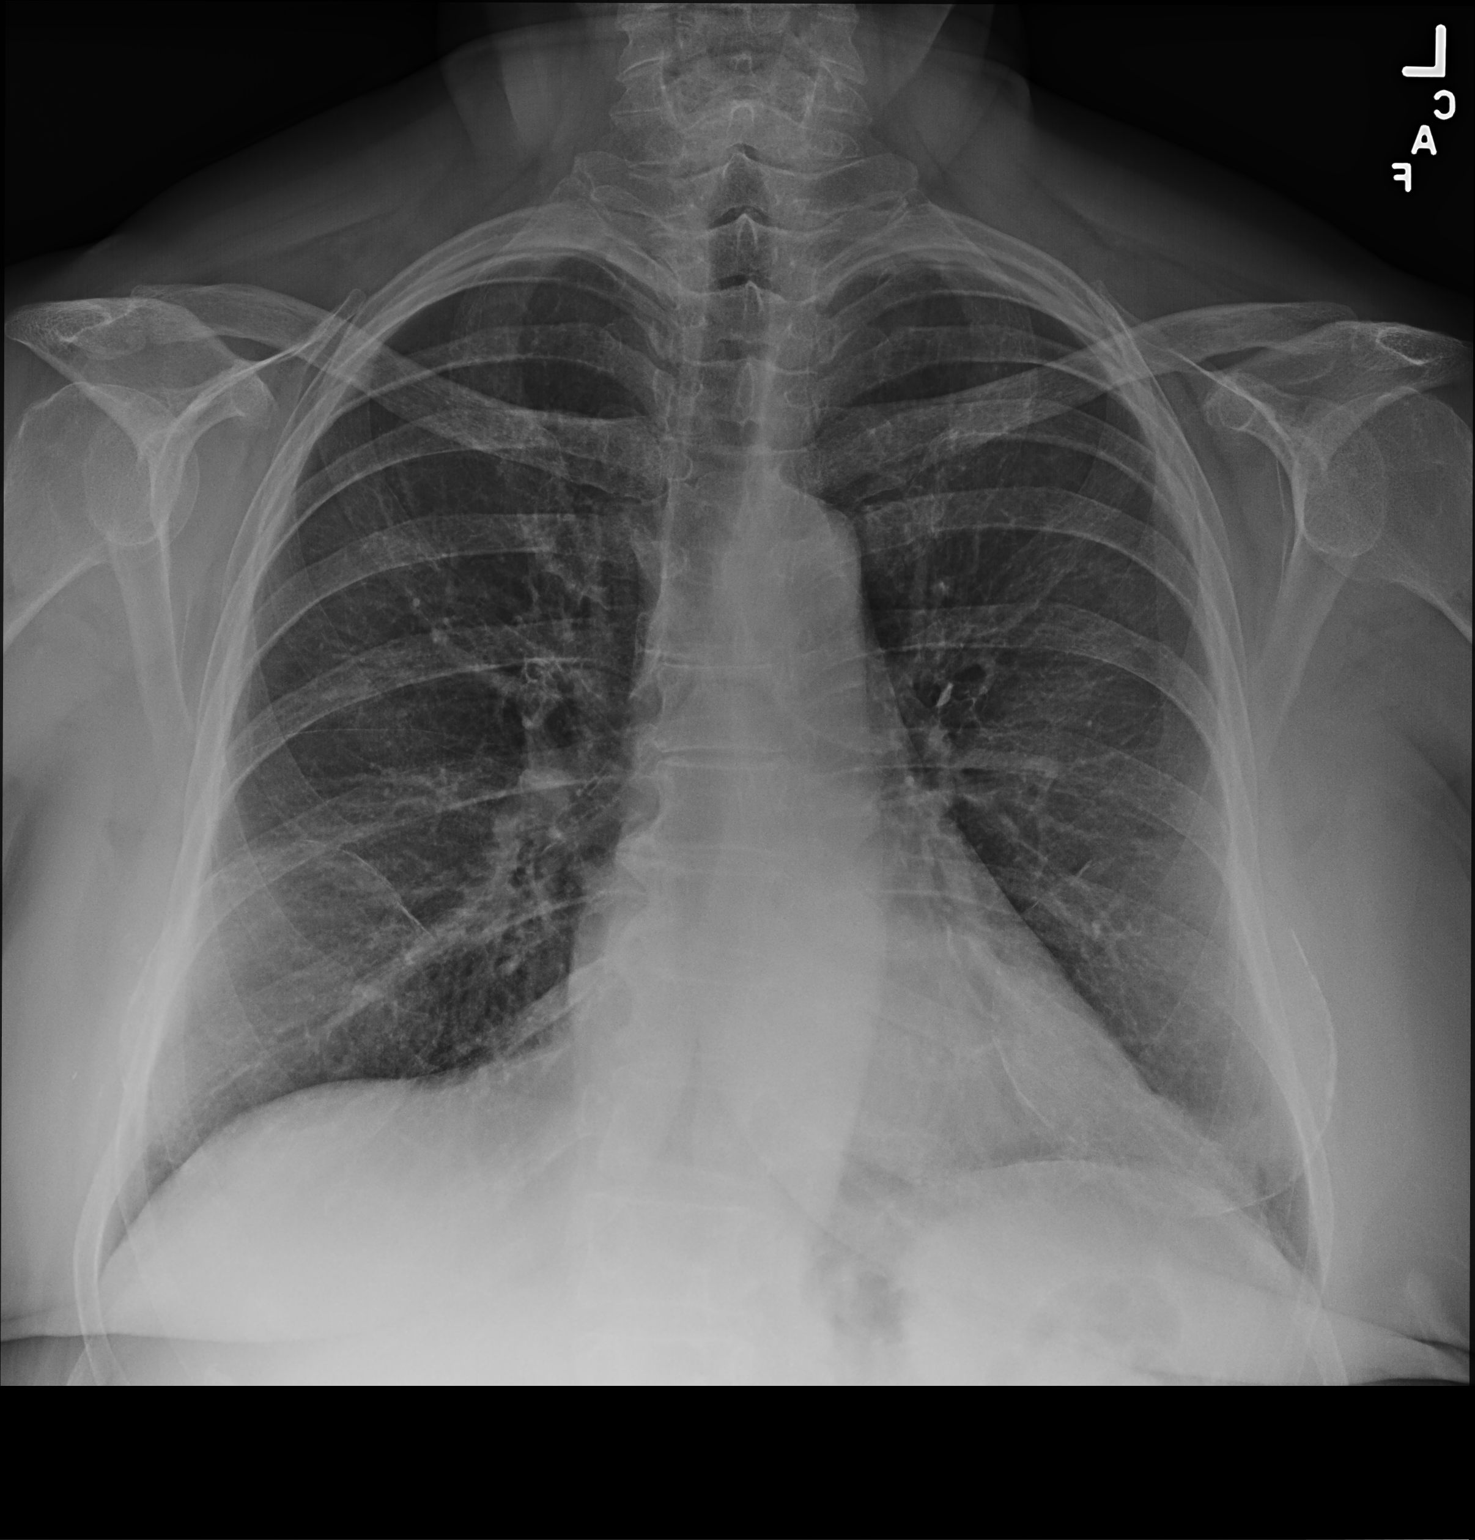

[chest lat]
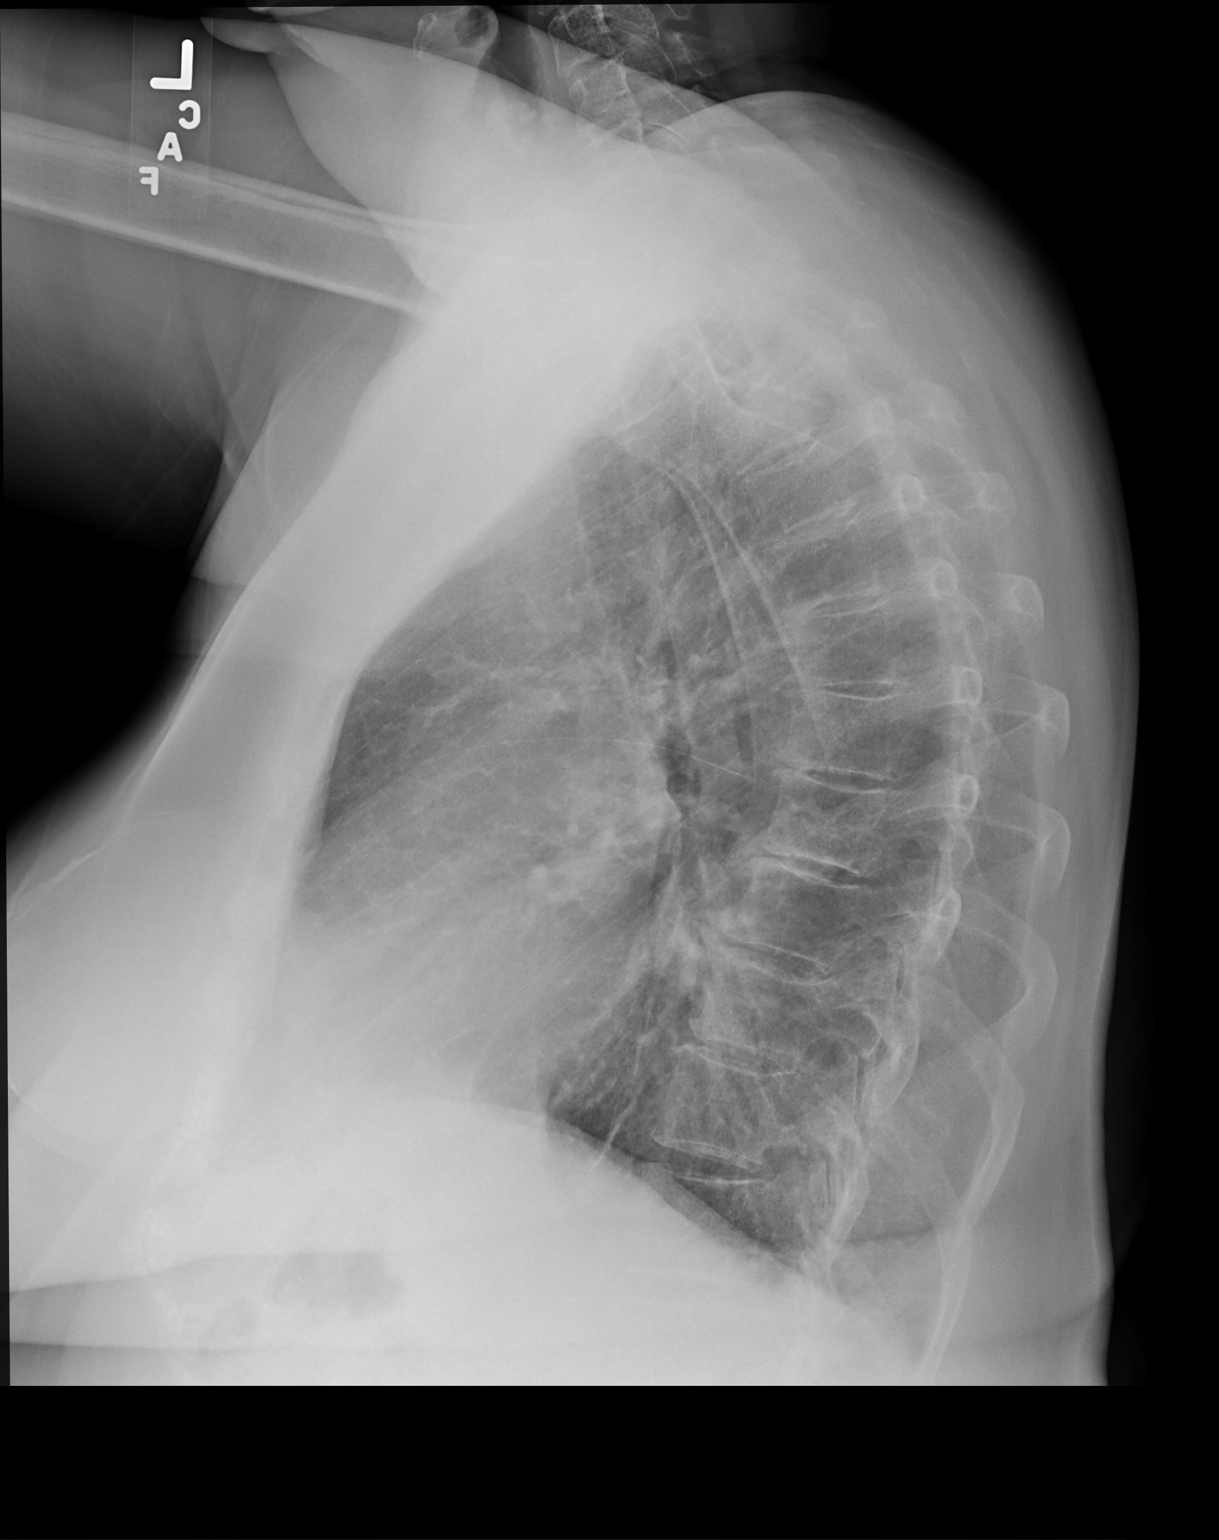

[2 of 2 positions shown; findings below may reference images not displayed]

FINDINGS: The lungs are adequately inflated. There is no focal infiltrate. The
interstitial markings are coarse. There is no pleural effusion. The
heart and pulmonary vascularity are normal. There is calcification
within the wall of the aortic arch. There are calcified breast
implants bilaterally. The bony thorax exhibits no acute abnormality.
IMPRESSION: Mild chronic bronchitic changes. No alveolar pneumonia, CHF, nor
other acute cardiopulmonary abnormality.

Aortic atherosclerosis.

## 2018-01-14 ENCOUNTER — Ambulatory Visit (INDEPENDENT_AMBULATORY_CARE_PROVIDER_SITE_OTHER): Payer: PPO

## 2018-01-14 VITALS — BP 132/80 | HR 79 | Temp 98.2°F | Resp 16 | Ht 63.0 in | Wt 202.4 lb

## 2018-01-14 DIAGNOSIS — Z0000 Encounter for general adult medical examination without abnormal findings: Secondary | ICD-10-CM | POA: Diagnosis not present

## 2018-01-14 DIAGNOSIS — Z6835 Body mass index (BMI) 35.0-35.9, adult: Secondary | ICD-10-CM

## 2018-01-14 DIAGNOSIS — E6609 Other obesity due to excess calories: Secondary | ICD-10-CM

## 2018-01-14 DIAGNOSIS — E559 Vitamin D deficiency, unspecified: Secondary | ICD-10-CM

## 2018-01-14 DIAGNOSIS — R5383 Other fatigue: Secondary | ICD-10-CM

## 2018-01-14 DIAGNOSIS — E7849 Other hyperlipidemia: Secondary | ICD-10-CM

## 2018-01-14 NOTE — Patient Instructions (Addendum)
  Ms. Baria , Thank you for taking time to come for your Medicare Wellness Visit. I appreciate your ongoing commitment to your health goals. Please review the following plan we discussed and let me know if I can assist you in the future.   Schedule eye exam.   Follow up as needed.    Have a great day!  These are the goals we discussed: Goals      Patient Stated   . Weight (lb) < 200 lb (90.7 kg) (pt-stated)     Lose 20lbs Portion controlled, low carb diet Increase exercise 150 minutes per week       This is a list of the screening recommended for you and due dates:  Health Maintenance  Topic Date Due  . Tetanus Vaccine  07/20/2023  . Flu Shot  Completed  . DEXA scan (bone density measurement)  Completed  . Pneumonia vaccines  Completed

## 2018-01-14 NOTE — Progress Notes (Signed)
Subjective:   Brianna Perry is a 84 y.o. female who presents for Medicare Annual (Subsequent) preventive examination.  Review of Systems:  No ROS.  Medicare Wellness Visit. Additional risk factors are reflected in the social history. Cardiac Risk Factors include: advanced age (>11men, >14 women);obesity (BMI >30kg/m2)     Objective:     Vitals: BP 132/80 (BP Location: Left Arm, Patient Position: Sitting, Cuff Size: Normal)   Pulse 79   Temp 98.2 F (36.8 C) (Oral)   Resp 16   Ht 5\' 3"  (1.6 m)   Wt 202 lb 6.4 oz (91.8 kg)   SpO2 95%   BMI 35.85 kg/m   Body mass index is 35.85 kg/m.  Advanced Directives 01/14/2018 01/11/2017 01/04/2016  Does Patient Have a Medical Advance Directive? Yes Yes No  Type of Paramedic of [place];Living will Living will;Healthcare Power of Attorney -  Does patient want to make changes to medical advance directive? No - Patient declined No - Patient declined -  Copy of [place] in Chart? Yes - validated most recent copy scanned in chart (See row information) No - copy requested -  Would patient like information on creating a medical advance directive? - - Yes - Educational materials given    Tobacco Social History   Tobacco Use  Smoking Status Never Smoker  Smokeless Tobacco Never Used     Counseling given: Not Answered   Clinical Intake:  Pre-visit preparation completed: Yes  Pain : No/denies pain    Nutritional Status: BMI > 30  Obese Diabetes: No  How often do you need to have someone help you when you read instructions, pamphlets, or other written materials from your doctor or pharmacy?: 1 - Never  Interpreter Needed?: No     Past Medical History:  Diagnosis Date  . Arthritis   . Breast cancer ([place]) 2010   DCIS; Right Breast c lumpectomy with 10 weeks radiation  . Breast mass x couple months   Right Breast  . Cancer ([place]) 2010   Right breast  . Chickenpox   . GERD  (gastroesophageal reflux disease)   . Urine incontinence    Past Surgical History:  Procedure Laterality Date  . ABDOMINAL HYSTERECTOMY  1968  . AUGMENTATION MAMMAPLASTY Bilateral 6283   silicone/[place]  . BREAST CYST ASPIRATION Left 2015   Negative  . BREAST CYST ASPIRATION Left 02/2016   aspiration cyst LEFT breast  . BREAST LUMPECTOMY Right 2005  . BREAST SURGERY Right 2010   Dr Clide Cliff in [place]  . [place]  . COLONOSCOPY WITH PROPOFOL N/A 03/23/2015   Procedure: COLONOSCOPY WITH PROPOFOL;  Surgeon: [name], MD;  Location: [facility] ENDOSCOPY;  Service: Endoscopy;  Laterality: N/A;  . EYE SURGERY    . TONSILLECTOMY  1941  . TONSILLECTOMY     Family History  Problem Relation Age of Onset  . Arthritis Mother   . Heart disease Mother   . Hypertension Mother   . Heart disease Father   . Breast cancer Paternal Aunt        33s  . Breast cancer Paternal Grandmother        great grandmother   Social History   Socioeconomic History  . Marital status: Married    Spouse name: Not on file  . Number of children: Not on file  . Years of education: Not on file  . Highest education level: Not on file  Occupational History  . Not on file  Social Needs  . Financial resource strain: Not hard at all  . Food insecurity:    Worry: Never true    Inability: Never true  . Transportation needs:    Medical: No    Non-medical: No  Tobacco Use  . Smoking status: Never Smoker  . Smokeless tobacco: Never Used  Substance and Sexual Activity  . Alcohol use: No  . Drug use: No  . Sexual activity: Never  Lifestyle  . Physical activity:    Days per week: 1 day    Minutes per session: 20 min  . Stress: Only a little  Relationships  . Social connections:    Talks on phone: Patient refused    Gets together: Patient refused    Attends religious service: Patient refused    Active member of club or organization: Patient refused    Attends meetings of  clubs or organizations: Patient refused    Relationship status: Patient refused  Other Topics Concern  . Not on file  Social History Narrative  . Not on file    Outpatient Encounter Medications as of 01/14/2018  Medication Sig  . aspirin 81 MG tablet Take 81 mg by mouth daily.  . calcium carbonate (OS-CAL) 600 MG TABS tablet Take 1,200 mg by mouth daily.  . Multiple Vitamin (MULTIVITAMIN) capsule Take 1 capsule by mouth daily.  . Multiple Vitamins-Minerals (RA VISION-VITE PRESERVE PO) Take 1 tablet by mouth daily.  . Omega-3 Fatty Acids (FISH OIL) 300 MG CAPS Take 1 capsule by mouth daily.  [name] omeprazole (PRILOSEC) 20 MG capsule TAKE 1 CAPSULE(20 MG) BY MOUTH EVERY MORNING  . TURMERIC PO Take 1 capsule by mouth daily.  . [DISCONTINUED] meloxicam (MOBIC) 15 MG tablet TAKE 1 TABLET(15 MG) BY MOUTH DAILY   No facility-administered encounter medications on file as of 01/14/2018.     Activities of Daily Living In your present state of health, do you have any difficulty performing the following activities: 01/14/2018  Hearing? N  Vision? N  Difficulty concentrating or making decisions? N  Walking or climbing stairs? N  Dressing or bathing? N  Doing errands, shopping? N  Preparing Food and eating ? N  Using the Toilet? N  In the past six months, have you accidently leaked urine? N  Do you have problems with loss of bowel control? N  Managing your Medications? N  Managing your Finances? N  Housekeeping or managing your Housekeeping? N  Some recent data might be hidden    Patient Care Team: Crecencio Mc, MD as PCP - General (Internal Medicine) Crecencio Mc, MD (Internal Medicine) Bary Castilla Forest Gleason, MD (General Surgery)    Assessment:   This is a routine wellness examination for Brianna Perry.  The goal of the wellness visit is to assist the patient how to close the gaps in care and create a preventative care plan for the patient.   The roster of all physicians providing medical  care to patient is listed in the Snapshot section of the chart.  No acute issues to be addressed by her doctor today. Requests fasting labs prior to cpe in 2 weeks. Labs placed, appointment scheduled. Deferred to pcp for follow up.   Taking calcium as appropriate/Osteoporosis risk reviewed.    Safety issues reviewed; Smoke and carbon monoxide detectors in the home. No firearms in the home. Wears seatbelts when driving or riding with others. No violence in the home.  They do not have  excessive sun exposure.  Discussed the need for sun protection: hats, long sleeves and the use of sunscreen if there is significant sun exposure.  Patient is alert, normal appearance, oriented to person/place/and time.  Correctly identified the president of the [place] and recalls of 2/3 words. Performs simple calculations and can read correct time from watch face.  Displays appropriate judgement.  No new identified risk were noted.  No failures at ADL's or IADL's.    BMI- discussed the importance of a healthy diet, water intake and the benefits of aerobic exercise. She plans to monitor her portions closer while making healthy choices and increase exercise up to 150 minutes per week. Adequate water intake. Educational material provided.   24 hour diet recall: Regular diet, notes mostly vegetables.   Dental- every 6 months.  Sleep patterns- Sleeps about 6 hours at night.  Wakes feeling rested.   Health maintenance gaps- closed.  Exercise Activities and Dietary recommendations Current Exercise Habits: Home exercise routine, Type of exercise: walking, Time (Minutes): 15, Frequency (Times/Week): 3, Weekly Exercise (Minutes/Week): 45, Intensity: Mild  Goals      Patient Stated   . Weight (lb) < 200 lb (90.7 kg) (pt-stated)     Lose 20lbs Portion controlled, low carb diet Increase exercise 150 minutes per week       Fall Risk Fall Risk  01/14/2018 01/11/2017 01/04/2016 12/29/2015 12/28/2014  Falls in  the past year? 0 No No No Yes  Number falls in past yr: - - - - 1  Injury with Fall? - - - - No  Follow up - - - - Education provided   Depression Screen PHQ 2/9 Scores 01/14/2018 01/11/2017 01/04/2016 12/29/2015  PHQ - 2 Score 0 0 0 0  PHQ- 9 Score - 0 - -     Cognitive Function MMSE - Mini Mental State Exam 01/11/2017 01/04/2016  Orientation to time 5 5  Orientation to Place 5 5  Registration 3 3  Attention/ Calculation 5 5  Recall 3 3  Language- name 2 objects 2 2  Language- repeat 1 1  Language- follow 3 step command 3 3  Language- read & follow direction 1 1  Write a sentence 1 1  Copy design 1 1  Total score 30 30     6CIT Screen 01/14/2018  What Year? 0 points  What month? 0 points  What time? 0 points  Count back from 20 0 points  Months in reverse 0 points  Repeat phrase 0 points  Total Score 0    Immunization History  Administered Date(s) Administered  . Influenza,inj,Quad PF,6+ Mos 11/27/2012  . Influenza-Unspecified 12/01/2013, 12/17/2014, 12/23/2015, 12/27/2016, 12/27/2017  . Pneumococcal Conjugate-13 12/22/2013  . Pneumococcal Polysaccharide-23 11/27/2009, 12/28/2014  . Tdap 07/19/2013  . Zoster 11/28/2010   Screening Tests Health Maintenance  Topic Date Due  . TETANUS/TDAP  07/20/2023  . INFLUENZA VACCINE  Completed  . DEXA SCAN  Completed  . PNA vac Low Risk Adult  Completed      Plan:   End of life planning; Advance aging; Advanced directives discussed. Copy of current HCPOA/Living Will on file.     Keep all routine maintenance appointments.   I have personally reviewed and noted the following in the patient's chart:   . Medical and social history . Use of alcohol, tobacco or illicit drugs  . Current medications and supplements . Functional ability and status . Nutritional status . Physical activity . Advanced directives . List of other physicians .  Hospitalizations, surgeries, and ER visits in previous 12  months . Vitals . Screenings to include cognitive, depression, and falls . Referrals and appointments  In addition, I have reviewed and discussed with patient certain preventive protocols, quality metrics, and best practice recommendations. A written personalized care plan for preventive services as well as general preventive health recommendations were provided to patient.     OBrien-Blaney, Colie Josten L, LPN  32/20/2542     I have reviewed the above information and agree with above.   Deborra Medina, MD

## 2018-01-16 ENCOUNTER — Other Ambulatory Visit (INDEPENDENT_AMBULATORY_CARE_PROVIDER_SITE_OTHER): Payer: PPO

## 2018-01-16 DIAGNOSIS — E66812 Obesity, class 2: Secondary | ICD-10-CM

## 2018-01-16 DIAGNOSIS — Z6835 Body mass index (BMI) 35.0-35.9, adult: Secondary | ICD-10-CM

## 2018-01-16 DIAGNOSIS — E7849 Other hyperlipidemia: Secondary | ICD-10-CM

## 2018-01-16 DIAGNOSIS — R5383 Other fatigue: Secondary | ICD-10-CM | POA: Diagnosis not present

## 2018-01-16 DIAGNOSIS — E559 Vitamin D deficiency, unspecified: Secondary | ICD-10-CM | POA: Diagnosis not present

## 2018-01-16 DIAGNOSIS — E6609 Other obesity due to excess calories: Secondary | ICD-10-CM

## 2018-01-16 LAB — COMPREHENSIVE METABOLIC PANEL
ALBUMIN: 4 g/dL (ref 3.5–5.2)
ALT: 18 U/L (ref 0–35)
AST: 17 U/L (ref 0–37)
Alkaline Phosphatase: 72 U/L (ref 39–117)
BUN: 13 mg/dL (ref 6–23)
CALCIUM: 9.4 mg/dL (ref 8.4–10.5)
CO2: 28 mEq/L (ref 19–32)
Chloride: 103 mEq/L (ref 96–112)
Creatinine, Ser: 0.65 mg/dL (ref 0.40–1.20)
GFR: 92.4 mL/min (ref >60.00)
Glucose, Bld: 110 mg/dL — ABNORMAL HIGH (ref 70–99)
POTASSIUM: 4.1 meq/L (ref 3.5–5.1)
SODIUM: 139 meq/L (ref 135–145)
Total Bilirubin: 0.4 mg/dL (ref 0.2–1.2)
Total Protein: 7.3 g/dL (ref 6.0–8.3)

## 2018-01-16 LAB — CBC WITH DIFFERENTIAL/PLATELET
BASOS ABS: 0.1 10*3/uL (ref 0.0–0.1)
Basophils Relative: 1.1 % (ref 0.0–3.0)
Eosinophils Absolute: 0.1 10*3/uL (ref 0.0–0.7)
Eosinophils Relative: 2.4 % (ref 0.0–5.0)
HEMATOCRIT: 40.5 % (ref 36.0–46.0)
Hemoglobin: 13.4 g/dL (ref 12.0–15.0)
LYMPHS PCT: 31.5 % (ref 12.0–46.0)
Lymphs Abs: 1.8 10*3/uL (ref 0.7–4.0)
MCHC: 33.2 g/dL (ref 30.0–36.0)
MCV: 86.4 fl (ref 78.0–100.0)
Monocytes Absolute: 0.6 10*3/uL (ref 0.1–1.0)
Monocytes Relative: 10.8 % (ref 3.0–12.0)
Neutro Abs: 3.1 10*3/uL (ref 1.4–7.7)
Neutrophils Relative %: 54.2 % (ref 43.0–77.0)
Platelets: 321 10*3/uL (ref 150.0–400.0)
RBC: 4.68 Mil/uL (ref 3.87–5.11)
RDW: 14.5 % (ref 11.5–15.5)
WBC: 5.7 10*3/uL (ref 4.0–10.5)

## 2018-01-16 LAB — LIPID PANEL
CHOL/HDL RATIO: 3
CHOLESTEROL: 209 mg/dL — AB (ref 0–200)
HDL: 75.5 mg/dL (ref >39.00)
LDL CALC: 104 mg/dL — AB (ref 0–99)
NonHDL: 133.5
TRIGLYCERIDES: 149 mg/dL (ref 0.0–149.0)
VLDL: 29.8 mg/dL (ref 0.0–40.0)

## 2018-01-16 LAB — VITAMIN D 25 HYDROXY (VIT D DEFICIENCY, FRACTURES): VITD: 38.57 ng/mL (ref 30.00–100.00)

## 2018-01-16 LAB — LDL CHOLESTEROL, DIRECT: Direct LDL: 125 mg/dL

## 2018-01-16 LAB — TSH: TSH: 4.38 u[IU]/mL (ref 0.35–4.50)

## 2018-01-17 ENCOUNTER — Encounter: Payer: Self-pay | Admitting: Internal Medicine

## 2018-01-17 ENCOUNTER — Other Ambulatory Visit: Payer: Self-pay | Admitting: Internal Medicine

## 2018-01-17 DIAGNOSIS — R7301 Impaired fasting glucose: Secondary | ICD-10-CM | POA: Insufficient documentation

## 2018-01-17 NOTE — Progress Notes (Signed)
a1c

## 2018-01-25 ENCOUNTER — Encounter: Payer: PPO | Admitting: Internal Medicine

## 2018-01-28 ENCOUNTER — Ambulatory Visit (INDEPENDENT_AMBULATORY_CARE_PROVIDER_SITE_OTHER): Payer: PPO | Admitting: Internal Medicine

## 2018-01-28 ENCOUNTER — Encounter: Payer: Self-pay | Admitting: Internal Medicine

## 2018-01-28 VITALS — BP 128/80 | HR 62 | Temp 98.3°F | Resp 15 | Ht 63.0 in | Wt 205.6 lb

## 2018-01-28 DIAGNOSIS — Z1231 Encounter for screening mammogram for malignant neoplasm of breast: Secondary | ICD-10-CM

## 2018-01-28 DIAGNOSIS — E6609 Other obesity due to excess calories: Secondary | ICD-10-CM

## 2018-01-28 DIAGNOSIS — R7301 Impaired fasting glucose: Secondary | ICD-10-CM

## 2018-01-28 DIAGNOSIS — Z299 Encounter for prophylactic measures, unspecified: Secondary | ICD-10-CM | POA: Diagnosis not present

## 2018-01-28 DIAGNOSIS — Z6835 Body mass index (BMI) 35.0-35.9, adult: Secondary | ICD-10-CM | POA: Diagnosis not present

## 2018-01-28 DIAGNOSIS — Z0000 Encounter for general adult medical examination without abnormal findings: Secondary | ICD-10-CM | POA: Diagnosis not present

## 2018-01-28 LAB — POCT GLYCOSYLATED HEMOGLOBIN (HGB A1C): Hemoglobin A1C: 5.6 % (ref 4.0–5.6)

## 2018-01-28 NOTE — Progress Notes (Signed)
Patient ID: Brianna Perry, female    DOB: Sep 27, 1932  Age: 84 y.o. MRN: [id number]  The patient is here for preventive examination and management of other chronic and acute problems.   Colon 2017 tubuaar adenoma  mammo 2019\ dexa 2016 osteopenia   The risk factors are reflected in the social history.  The roster of all physicians providing medical care to patient - is listed in the Snapshot section of the chart.  Activities of daily living:  The patient is 100% independent in all ADLs: dressing, toileting, feeding as well as independent mobility  Home safety : The patient has smoke detectors in the home. They wear seatbelts.  There are no firearms at home. There is no violence in the home.   There is no risks for hepatitis, STDs or HIV. There is no   history of blood transfusion. They have no travel history to infectious disease endemic areas of the world.  The patient has seen their dentist in the last six month. They have seen their eye doctor in the last year. They admit to slight hearing difficulty with regard to whispered voices and some television programs.  They have deferred audiologic testing in the last year.  They do not  have excessive sun exposure. Discussed the need for sun protection: hats, long sleeves and use of sunscreen if there is significant sun exposure.   Diet: the importance of a healthy diet is discussed. They do have a healthy diet.  The benefits of regular aerobic exercise were discussed. She walks 4 times per week ,  20 minutes.   Depression screen: there are no signs or vegative symptoms of depression- irritability, change in appetite, anhedonia, sadness/tearfullness.  Cognitive assessment: the patient manages all their financial and personal affairs and is actively engaged. They could relate day,date,year and events; recalled 2/3 objects at 3 minutes; performed clock-face test normally.  The following portions of the patient's history were reviewed and  updated as appropriate: allergies, current medications, past family history, past medical history,  past surgical history, past social history  and problem list.  Visual acuity was not assessed per patient preference since she has regular follow up with her ophthalmologist. Hearing and body mass index were assessed and reviewed.   During the course of the visit the patient was educated and counseled about appropriate screening and preventive services including : fall prevention , diabetes screening, nutrition counseling, colorectal cancer screening, and recommended immunizations.    CC: The primary encounter diagnosis was Encounter for screening mammogram for breast cancer. Diagnoses of Impaired fasting glucose, Encounter for preventive measure, and Class 2 obesity due to excess calories without serious comorbidity with body mass index (BMI) of 35.0 to 35.9 in adult were also pertinent to this visit.  History Lundon has a past medical history of Arthritis, Breast cancer ([place]) (2010), Breast mass (x couple months), Cancer ([place]) (2010), Chickenpox, GERD (gastroesophageal reflux disease), and Urine incontinence.   She has a past surgical history that includes Abdominal hysterectomy (1968); Tonsillectomy (1941); Colonoscopy; Eye surgery; Tonsillectomy; Colonoscopy with propofol (N/A, 03/23/2015); Breast surgery (Right, 2010); Augmentation mammaplasty (Bilateral, 1973); Breast cyst aspiration (Left, 2015); Breast cyst aspiration (Left, 02/2016); and Breast lumpectomy (Right, 2005).   Her family history includes Arthritis in her mother; Breast cancer in her paternal aunt and paternal grandmother; Heart disease in her father and mother; Hypertension in her mother.She reports that she has never smoked. She has never used smokeless tobacco. She reports that she does not drink alcohol  or use drugs.  Outpatient Medications Prior to Visit  Medication Sig Dispense Refill  . aspirin 81 MG tablet Take 81 mg by mouth  daily.    . calcium carbonate (OS-CAL) 600 MG TABS tablet Take 1,200 mg by mouth daily.    . Multiple Vitamin (MULTIVITAMIN) capsule Take 1 capsule by mouth daily.    . Multiple Vitamins-Minerals (RA VISION-VITE PRESERVE PO) Take 1 tablet by mouth daily.    . Omega-3 Fatty Acids (FISH OIL) 300 MG CAPS Take 1 capsule by mouth daily.    [name] omeprazole (PRILOSEC) 20 MG capsule TAKE 1 CAPSULE(20 MG) BY MOUTH EVERY MORNING 90 capsule 1  . TURMERIC PO Take 1 capsule by mouth daily.     No facility-administered medications prior to visit.     Review of Systems   Patient denies headache, fevers, malaise, unintentional weight loss, skin rash, eye pain, sinus congestion and sinus pain, sore throat, dysphagia,  hemoptysis , cough, dyspnea, wheezing, chest pain, palpitations, orthopnea, edema, abdominal pain, nausea, melena, diarrhea, constipation, flank pain, dysuria, hematuria, urinary  Frequency, nocturia, numbness, tingling, seizures,  Focal weakness, Loss of consciousness,  Tremor, insomnia, depression, anxiety, and suicidal ideation.      Objective:  BP 128/80 (BP Location: Left Arm, Patient Position: Sitting, Cuff Size: Large)   Pulse 62   Temp 98.3 F (36.8 C) (Oral)   Resp 15   Ht 5\' 3"  (1.6 m)   Wt 205 lb 9.6 oz (93.3 kg)   SpO2 99%   BMI 36.42 kg/m   Physical Exam  General appearance: alert, cooperative and appears stated age Head: Normocephalic, without obvious abnormality, atraumatic Eyes: conjunctivae/corneas clear. PERRL, EOM's intact. Fundi benign. Ears: normal TM's and external ear canals both ears Nose: Nares normal. Septum midline. Mucosa normal. No drainage or sinus tenderness. Throat: lips, mucosa, and tongue normal; teeth and gums normal Neck: no adenopathy, no carotid bruit, no JVD, supple, symmetrical, trachea midline and thyroid not enlarged, symmetric, no tenderness/mass/nodules Lungs: clear to auscultation bilaterally Breasts: normal appearance, no masses or  tenderness Heart: regular rate and rhythm, S1, S2 normal, no murmur, click, rub or gallop Abdomen: soft, non-tender; bowel sounds normal; no masses,  no organomegaly Extremities: extremities normal, atraumatic, no cyanosis or edema Pulses: 2+ and symmetric Skin: Skin color, texture, turgor normal. No rashes or lesions Neurologic: Alert and oriented X 3, normal strength and tone. Normal symmetric reflexes. Normal coordination and gait.    Assessment & Plan:   Problem List Items Addressed This Visit    Encounter for preventive measure    Annual comprehensive preventive exam was done as well as an evaluation and management of chronic conditions .  During the course of the visit the patient was educated and counseled about appropriate screening and preventive services including :  diabetes screening, lipid analysis with projected 10 year  risk for CAD , nutrition counseling, breast, cervical and colorectal cancer screening, and recommended immunizations.  Printed recommendations for health maintenance screenings was given      Impaired fasting glucose    Her  random glucose is  elevated but her A1c remains < 5.8 .  I recommend she follow a low glycemic index diet and particpate regularly in an aerobic  exercise activity.  We should check an A1c in 6 months.    Lab Results  Component Value Date   HGBA1C 5.6 01/28/2018         Relevant Orders   POCT HgB A1C (Completed)   Obesity  I have addressed  BMI and recommended wt loss of 10% of body weight over the next 6 months using a low glycemic index diet and regular exercise a minimum of 5 days per week.         Other Visit Diagnoses    Encounter for screening mammogram for breast cancer    -  Primary   Relevant Orders   MM 3D SCREEN BREAST BILATERAL      I am having Clydene Fake. Knouff maintain her multivitamin, FISH OIL, calcium carbonate, aspirin, Multiple Vitamins-Minerals (RA VISION-VITE PRESERVE PO), TURMERIC PO, and  omeprazole.  No orders of the defined types were placed in this encounter.   There are no discontinued medications.  Follow-up: No follow-ups on file.   Crecencio Mc, MD

## 2018-01-28 NOTE — Patient Instructions (Addendum)
Your  fasting glucose has never been  diagnostic of diabetes; but it  suggests that  you are at risk for developing type 2 Diabetes.       I recommend  Cutting back on  starches and increasing your  participation in a walking program for exercise to 5 days per week /  Walking in the pool is  GREAT exericise and does not put stress on your joints  Your breakfast choices can  be lower in carbohydrates (to lower your blood sugars)   Here are some options that are quick   1) There are several  premixed protein drink s available widely .  Here are my top choices:  1) Premier Protein:  Nutritional analysis :  160 cal  30 g protein  1 g sugar 50% calcium needs   Vladimir Faster and [employer]  2) Atkins advantage  3) EAS Carb Control   4) Muscle Milk  5) Ensure Protein (the one with 30 g protein )   Danton Clap now makes a frozen breakfast frittata that can be microwaved in 2 minutes , and an "Eggwhich" .  Neither have bread or potatoes in them.  Truett Perna makes a microwaveable omelet called "Just crack an egg"  that is very low in sodium and carbohydrates.    Scrambled eggs,  omelets and breakfast meat (as long as it is not breaded ) are also low carb.   WHAT TO AVOID:  Bagels (unless you buy Bagel Thins) Biscuits Croissants Waffles/French Toast/Pancakes Muffings Oatmeal Cereal Bananas  Your annual mammogram has been ordered.  You are encouraged (required) to call to make your appointment at [facility]   Health Maintenance for Postmenopausal Women Menopause is a normal process in which your reproductive ability comes to an end. This process happens gradually over a span of months to years, usually between the ages of 42 and 68. Menopause is complete when you have missed 12 consecutive menstrual periods. It is important to talk with your health care provider about some of the most common conditions that affect postmenopausal women, such as heart disease, cancer, and bone loss  (osteoporosis). Adopting a healthy lifestyle and getting preventive care can help to promote your health and wellness. Those actions can also lower your chances of developing some of these common conditions. What should I know about menopause? During menopause, you may experience a number of symptoms, such as:  Moderate-to-severe hot flashes.  Night sweats.  Decrease in sex drive.  Mood swings.  Headaches.  Tiredness.  Irritability.  Memory problems.  Insomnia.  Choosing to treat or not to treat menopausal changes is an individual decision that you make with your health care provider. What should I know about hormone replacement therapy and supplements? Hormone therapy products are effective for treating symptoms that are associated with menopause, such as hot flashes and night sweats. Hormone replacement carries certain risks, especially as you become older. If you are thinking about using estrogen or estrogen with progestin treatments, discuss the benefits and risks with your health care provider. What should I know about heart disease and stroke? Heart disease, heart attack, and stroke become more likely as you age. This may be due, in part, to the hormonal changes that your body experiences during menopause. These can affect how your body processes dietary fats, triglycerides, and cholesterol. Heart attack and stroke are both medical emergencies. There are many things that you can do to help prevent heart disease and stroke:  Have  your blood pressure checked at least every 1-2 years. High blood pressure causes heart disease and increases the risk of stroke.  If you are 75-25 years old, ask your health care provider if you should take aspirin to prevent a heart attack or a stroke.  Do not use any tobacco products, including cigarettes, chewing tobacco, or electronic cigarettes. If you need help quitting, ask your health care provider.  It is important to eat a healthy diet and  maintain a healthy weight. ? Be sure to include plenty of vegetables, fruits, low-fat dairy products, and lean protein. ? Avoid eating foods that are high in solid fats, added sugars, or salt (sodium).  Get regular exercise. This is one of the most important things that you can do for your health. ? Try to exercise for at least 150 minutes each week. The type of exercise that you do should increase your heart rate and make you sweat. This is known as moderate-intensity exercise. ? Try to do strengthening exercises at least twice each week. Do these in addition to the moderate-intensity exercise.  Know your numbers.Ask your health care provider to check your cholesterol and your blood glucose. Continue to have your blood tested as directed by your health care provider.  What should I know about cancer screening? There are several types of cancer. Take the following steps to reduce your risk and to catch any cancer development as early as possible. Breast Cancer  Practice breast self-awareness. ? This means understanding how your breasts normally appear and feel. ? It also means doing regular breast self-exams. Let your health care provider know about any changes, no matter how small.  If you are 28 or older, have a clinician do a breast exam (clinical breast exam or CBE) every year. Depending on your age, family history, and medical history, it may be recommended that you also have a yearly breast X-ray (mammogram).  If you have a family history of breast cancer, talk with your health care provider about genetic screening.  If you are at high risk for breast cancer, talk with your health care provider about having an MRI and a mammogram every year.  Breast cancer (BRCA) gene test is recommended for women who have family members with BRCA-related cancers. Results of the assessment will determine the need for genetic counseling and BRCA1 and for BRCA2 testing. BRCA-related cancers include these  types: ? Breast. This occurs in males or females. ? Ovarian. ? Tubal. This may also be called fallopian tube cancer. ? Cancer of the abdominal or pelvic lining (peritoneal cancer). ? Prostate. ? Pancreatic.  Cervical, Uterine, and Ovarian Cancer Your health care provider may recommend that you be screened regularly for cancer of the pelvic organs. These include your ovaries, uterus, and vagina. This screening involves a pelvic exam, which includes checking for microscopic changes to the surface of your cervix (Pap test).  For women ages 21-65, health care providers may recommend a pelvic exam and a Pap test every three years. For women ages 64-65, they may recommend the Pap test and pelvic exam, combined with testing for human papilloma virus (HPV), every five years. Some types of HPV increase your risk of cervical cancer. Testing for HPV may also be done on women of any age who have unclear Pap test results.  Other health care providers may not recommend any screening for nonpregnant women who are considered low risk for pelvic cancer and have no symptoms. Ask your health care provider if a  screening pelvic exam is right for you.  If you have had past treatment for cervical cancer or a condition that could lead to cancer, you need Pap tests and screening for cancer for at least 20 years after your treatment. If Pap tests have been discontinued for you, your risk factors (such as having a new sexual partner) need to be reassessed to determine if you should start having screenings again. Some women have medical problems that increase the chance of getting cervical cancer. In these cases, your health care provider may recommend that you have screening and Pap tests more often.  If you have a family history of uterine cancer or ovarian cancer, talk with your health care provider about genetic screening.  If you have vaginal bleeding after reaching menopause, tell your health care provider.  There  are currently no reliable tests available to screen for ovarian cancer.  Lung Cancer Lung cancer screening is recommended for adults 47-59 years old who are at high risk for lung cancer because of a history of smoking. A yearly low-dose CT scan of the lungs is recommended if you:  Currently smoke.  Have a history of at least 30 pack-years of smoking and you currently smoke or have quit within the past 15 years. A pack-year is smoking an average of one pack of cigarettes per day for one year.  Yearly screening should:  Continue until it has been 15 years since you quit.  Stop if you develop a health problem that would prevent you from having lung cancer treatment.  Colorectal Cancer  This type of cancer can be detected and can often be prevented.  Routine colorectal cancer screening usually begins at age 47 and continues through age 67.  If you have risk factors for colon cancer, your health care provider may recommend that you be screened at an earlier age.  If you have a family history of colorectal cancer, talk with your health care provider about genetic screening.  Your health care provider may also recommend using home test kits to check for hidden blood in your stool.  A small camera at the end of a tube can be used to examine your colon directly (sigmoidoscopy or colonoscopy). This is done to check for the earliest forms of colorectal cancer.  Direct examination of the colon should be repeated every 5-10 years until age 53. However, if early forms of precancerous polyps or small growths are found or if you have a family history or genetic risk for colorectal cancer, you may need to be screened more often.  Skin Cancer  Check your skin from head to toe regularly.  Monitor any moles. Be sure to tell your health care provider: ? About any new moles or changes in moles, especially if there is a change in a mole's shape or color. ? If you have a mole that is larger than the  size of a pencil eraser.  If any of your family members has a history of skin cancer, especially at a young age, talk with your health care provider about genetic screening.  Always use sunscreen. Apply sunscreen liberally and repeatedly throughout the day.  Whenever you are outside, protect yourself by wearing long sleeves, pants, a wide-brimmed hat, and sunglasses.  What should I know about osteoporosis? Osteoporosis is a condition in which bone destruction happens more quickly than new bone creation. After menopause, you may be at an increased risk for osteoporosis. To help prevent osteoporosis or the bone fractures that  can happen because of osteoporosis, the following is recommended:  If you are 59-69 years old, get at least 1,000 mg of calcium and at least 600 mg of vitamin D per day.  If you are older than age 87 but younger than age 35, get at least 1,200 mg of calcium and at least 600 mg of vitamin D per day.  If you are older than age 35, get at least 1,200 mg of calcium and at least 800 mg of vitamin D per day.  Smoking and excessive alcohol intake increase the risk of osteoporosis. Eat foods that are rich in calcium and vitamin D, and do weight-bearing exercises several times each week as directed by your health care provider. What should I know about how menopause affects my mental health? Depression may occur at any age, but it is more common as you become older. Common symptoms of depression include:  Low or sad mood.  Changes in sleep patterns.  Changes in appetite or eating patterns.  Feeling an overall lack of motivation or enjoyment of activities that you previously enjoyed.  Frequent crying spells.  Talk with your health care provider if you think that you are experiencing depression. What should I know about immunizations? It is important that you get and maintain your immunizations. These include:  Tetanus, diphtheria, and pertussis (Tdap) booster  vaccine.  Influenza every year before the flu season begins.  Pneumonia vaccine.  Shingles vaccine.  Your health care provider may also recommend other immunizations. This information is not intended to replace advice given to you by your health care provider. Make sure you discuss any questions you have with your health care provider. Document Released: 04/07/2005 Document Revised: 09/03/2015 Document Reviewed: 11/17/2014 Elsevier Interactive Patient Education  2018 [employer].

## 2018-01-29 NOTE — Assessment & Plan Note (Signed)
Annual comprehensive preventive exam was done as well as an evaluation and management of chronic conditions .  During the course of the visit the patient was educated and counseled about appropriate screening and preventive services including :  diabetes screening, lipid analysis with projected  10 year  risk for CAD , nutrition counseling, breast, cervical and colorectal cancer screening, and recommended immunizations.  Printed recommendations for health maintenance screenings was given 

## 2018-01-29 NOTE — Assessment & Plan Note (Signed)
I have addressed  BMI and recommended wt loss of 10% of body weight over the next 6 months using a low glycemic index diet and regular exercise a minimum of 5 days per week.   

## 2018-01-29 NOTE — Assessment & Plan Note (Signed)
Her  random glucose is  elevated but her A1c remains < 5.8 .  I recommend she follow a low glycemic index diet and particpate regularly in an aerobic  exercise activity.  We should check an A1c in 6 months.    Lab Results  Component Value Date   HGBA1C 5.6 01/28/2018

## 2018-02-25 ENCOUNTER — Other Ambulatory Visit: Payer: Self-pay | Admitting: Internal Medicine

## 2018-02-25 DIAGNOSIS — Z1239 Encounter for other screening for malignant neoplasm of breast: Secondary | ICD-10-CM

## 2018-03-26 ENCOUNTER — Ambulatory Visit
Admission: RE | Admit: 2018-03-26 | Discharge: 2018-03-26 | Disposition: A | Discharge status: Home / Self Care | Payer: PPO | Source: Ambulatory Visit | Attending: Internal Medicine | Admitting: Internal Medicine

## 2018-03-26 ENCOUNTER — Other Ambulatory Visit: Payer: Self-pay | Admitting: Internal Medicine

## 2018-03-26 DIAGNOSIS — Z1239 Encounter for other screening for malignant neoplasm of breast: Secondary | ICD-10-CM

## 2018-03-26 DIAGNOSIS — Z1231 Encounter for screening mammogram for malignant neoplasm of breast: Secondary | ICD-10-CM | POA: Insufficient documentation

## 2018-03-26 HISTORY — DX: Personal history of irradiation: Z92.3

## 2018-04-07 ENCOUNTER — Other Ambulatory Visit: Payer: Self-pay | Admitting: Internal Medicine

## 2018-08-06 ENCOUNTER — Telehealth: Payer: Self-pay | Admitting: Internal Medicine

## 2018-08-06 NOTE — Telephone Encounter (Signed)
Brianna Perry will set up a virtual visit so I can evaluate both issues.

## 2018-08-06 NOTE — Telephone Encounter (Signed)
Spoke with Mrs. Foucher and she stated that she does not have the means to do a virtual visit and the pt does not live with her so she doesn't know how a telephone visit will work. I explained to Mrs. Peach that since the pt has a rash that she will not be allowed to come in the office with the Covid-19 protocols/precautions. Mrs. Kysar was wanting to know if Dr. Derrel Nip could do a visit with the pt out in the parking lot?

## 2018-08-06 NOTE — Telephone Encounter (Signed)
No,  A telephone encoutner.

## 2018-08-08 NOTE — Telephone Encounter (Signed)
I called the pt and she states the rash is not on her its her granddaughter who I think she stated is blind and deaf and she can't talk on the telephone because she does not live with the patient.  I informed her you could not come to the parking lot.  She only has a land line.   I don't know what to do with the patient. Sim Choquette,cma

## 2018-08-13 NOTE — Telephone Encounter (Signed)
Pt's daughter is scheduled for a telephone visit Thursday but for the mother to talk to [place] because the pt is deaf and blind.

## 2018-10-03 ENCOUNTER — Other Ambulatory Visit: Payer: Self-pay

## 2018-10-03 ENCOUNTER — Encounter: Payer: Self-pay | Admitting: General Surgery

## 2018-10-03 MED ORDER — OMEPRAZOLE 20 MG PO CPDR: DELAYED_RELEASE_CAPSULE | ORAL | 1 refills | Status: DC

## 2018-10-28 ENCOUNTER — Other Ambulatory Visit: Payer: Self-pay | Admitting: Internal Medicine

## 2018-10-28 MED ORDER — PREDNISONE 10 MG PO TABS: ORAL_TABLET | ORAL | 0 refills | Status: DC

## 2018-11-21 ENCOUNTER — Telehealth: Payer: Self-pay | Admitting: Internal Medicine

## 2018-11-21 DIAGNOSIS — R5383 Other fatigue: Secondary | ICD-10-CM

## 2018-11-21 DIAGNOSIS — R7301 Impaired fasting glucose: Secondary | ICD-10-CM

## 2018-11-21 DIAGNOSIS — Z79899 Other long term (current) drug therapy: Secondary | ICD-10-CM

## 2018-11-21 DIAGNOSIS — E7849 Other hyperlipidemia: Secondary | ICD-10-CM

## 2018-11-21 NOTE — Telephone Encounter (Signed)
PT has a cpe scheduled for 01/31/2019 and would like to do labs before appt. Need orders put in.

## 2018-11-25 NOTE — Telephone Encounter (Signed)
Pt would like to have lab work done prior to her CPE on 01/31/2019. I have ordered CBC, CMP, A1c, Lipid, and TSH. Is there anything else that needs to be ordered?

## 2019-01-20 ENCOUNTER — Ambulatory Visit: Payer: PPO | Admitting: Internal Medicine

## 2019-01-20 ENCOUNTER — Ambulatory Visit: Payer: PPO

## 2019-01-31 ENCOUNTER — Encounter: Payer: PPO | Admitting: Internal Medicine

## 2019-02-10 ENCOUNTER — Other Ambulatory Visit (INDEPENDENT_AMBULATORY_CARE_PROVIDER_SITE_OTHER): Payer: PPO

## 2019-02-10 ENCOUNTER — Other Ambulatory Visit: Payer: Self-pay

## 2019-02-10 DIAGNOSIS — R7301 Impaired fasting glucose: Secondary | ICD-10-CM | POA: Diagnosis not present

## 2019-02-10 DIAGNOSIS — E7849 Other hyperlipidemia: Secondary | ICD-10-CM

## 2019-02-10 DIAGNOSIS — Z79899 Other long term (current) drug therapy: Secondary | ICD-10-CM

## 2019-02-10 DIAGNOSIS — R5383 Other fatigue: Secondary | ICD-10-CM | POA: Diagnosis not present

## 2019-02-10 LAB — COMPREHENSIVE METABOLIC PANEL
ALT: 20 U/L (ref 0–35)
AST: 20 U/L (ref 0–37)
Albumin: 4.1 g/dL (ref 3.5–5.2)
Alkaline Phosphatase: 79 U/L (ref 39–117)
BUN: 11 mg/dL (ref 6–23)
CO2: 28 mEq/L (ref 19–32)
Calcium: 9.5 mg/dL (ref 8.4–10.5)
Chloride: 104 mEq/L (ref 96–112)
Creatinine, Ser: 0.69 mg/dL (ref 0.40–1.20)
GFR: 80.94 mL/min (ref >60.00)
Glucose, Bld: 109 mg/dL — ABNORMAL HIGH (ref 70–99)
Potassium: 3.9 mEq/L (ref 3.5–5.1)
Sodium: 140 mEq/L (ref 135–145)
Total Bilirubin: 0.3 mg/dL (ref 0.2–1.2)
Total Protein: 7 g/dL (ref 6.0–8.3)

## 2019-02-10 LAB — CBC WITH DIFFERENTIAL/PLATELET
Basophils Absolute: 0.1 10*3/uL (ref 0.0–0.1)
Basophils Relative: 0.9 % (ref 0.0–3.0)
Eosinophils Absolute: 0.1 10*3/uL (ref 0.0–0.7)
Eosinophils Relative: 2.1 % (ref 0.0–5.0)
HCT: 40.7 % (ref 36.0–46.0)
Hemoglobin: 13.4 g/dL (ref 12.0–15.0)
Lymphocytes Relative: 29.2 % (ref 12.0–46.0)
Lymphs Abs: 1.7 10*3/uL (ref 0.7–4.0)
MCHC: 32.9 g/dL (ref 30.0–36.0)
MCV: 87.6 fl (ref 78.0–100.0)
Monocytes Absolute: 0.6 10*3/uL (ref 0.1–1.0)
Monocytes Relative: 9.7 % (ref 3.0–12.0)
Neutro Abs: 3.4 10*3/uL (ref 1.4–7.7)
Neutrophils Relative %: 58.1 % (ref 43.0–77.0)
Platelets: 334 10*3/uL (ref 150.0–400.0)
RBC: 4.64 Mil/uL (ref 3.87–5.11)
RDW: 14.8 % (ref 11.5–15.5)
WBC: 5.9 10*3/uL (ref 4.0–10.5)

## 2019-02-10 LAB — LIPID PANEL
Cholesterol: 224 mg/dL — ABNORMAL HIGH (ref 0–200)
HDL: 79.9 mg/dL (ref >39.00)
LDL Cholesterol: 121 mg/dL — ABNORMAL HIGH (ref 0–99)
NonHDL: 144.16
Total CHOL/HDL Ratio: 3
Triglycerides: 118 mg/dL (ref 0.0–149.0)
VLDL: 23.6 mg/dL (ref 0.0–40.0)

## 2019-02-10 LAB — HEMOGLOBIN A1C: Hgb A1c MFr Bld: 6.2 % (ref 4.6–6.5)

## 2019-02-10 LAB — TSH: TSH: 3.24 u[IU]/mL (ref 0.35–4.50)

## 2019-02-12 ENCOUNTER — Ambulatory Visit (INDEPENDENT_AMBULATORY_CARE_PROVIDER_SITE_OTHER): Payer: PPO | Admitting: Internal Medicine

## 2019-02-12 ENCOUNTER — Encounter: Payer: Self-pay | Admitting: Internal Medicine

## 2019-02-12 ENCOUNTER — Other Ambulatory Visit: Payer: Self-pay

## 2019-02-12 DIAGNOSIS — R7303 Prediabetes: Secondary | ICD-10-CM | POA: Insufficient documentation

## 2019-02-12 DIAGNOSIS — F5104 Psychophysiologic insomnia: Secondary | ICD-10-CM | POA: Diagnosis not present

## 2019-02-12 NOTE — Progress Notes (Signed)
Telephone  Note  This visit type was conducted due to national recommendations for restrictions regarding the COVID-19 pandemic (e.g. social distancing).  This format is felt to be most appropriate for this patient at this time.  All issues noted in this document were discussed and addressed.  No physical exam was performed (except for noted visual exam findings with Video Visits).   I connected with@ on 02/12/19 at  3:00 PM EST by telephone and verified that I am speaking with the correct person using two identifiers. Location patient: home Location provider: work or home office Persons participating in the virtual visit: patient, provider  I discussed the limitations, risks, security and privacy concerns of performing an evaluation and management service by telephone and the availability of in person appointments. I also discussed with the patient that there may be a patient responsible charge related to this service. The patient expressed understanding and agreed to proceed.  Reason for visit: follow up   HPI:  85 yr old female with history of GERD  Chronic knee and low back pain , history of breast ca here for follow up   Prediabetes suggested by A1c of 6.2 and fasting glucose of 107  Diet reviewed,  Does not eat sweets  Cereal ,  Or starches with every meal.  Not exercising. Regularly.  Currently in the process of moving from [facility] to a private home.  duaghter Vinnie Level helping.    The patient has no signs or symptoms of COVID 19 infection (fever, cough, sore throat  or shortness of breath beyond what is typical for patient).  Patient denies contact with other persons with the above mentioned symptoms or with anyone confirmed to have COVID 19 .  She has been minimizing her contact with the public and using a mask and hand sanitizer when she comes into any contact with the public.    ROS: See pertinent positives and negatives per HPI.  Past Medical History:  Diagnosis Date  .  Arthritis   . Breast cancer ([place]) 2010   DCIS; Right Breast c lumpectomy with 10 weeks radiation  . Breast mass x couple months   Right Breast  . Cancer ([place]) 2010   Right breast  . Chickenpox   . GERD (gastroesophageal reflux disease)   . Personal history of radiation therapy   . Urine incontinence     Past Surgical History:  Procedure Laterality Date  . ABDOMINAL HYSTERECTOMY  1968  . AUGMENTATION MAMMAPLASTY Bilateral 0000000   silicone/[place]  . BREAST CYST ASPIRATION Left 2015   Negative  . BREAST CYST ASPIRATION Left 02/2016   aspiration cyst LEFT breast  . BREAST LUMPECTOMY Right 2005  . BREAST SURGERY Right 2010   Dr Clide Cliff in [place]  . [place]  . COLONOSCOPY WITH PROPOFOL N/A 03/23/2015   Procedure: COLONOSCOPY WITH PROPOFOL;  Surgeon: [name], MD;  Location: [facility] ENDOSCOPY;  Service: Endoscopy;  Laterality: N/A;  . EYE SURGERY    . TONSILLECTOMY  1941  . TONSILLECTOMY      Family History  Problem Relation Age of Onset  . Arthritis Mother   . Heart disease Mother   . Hypertension Mother   . Heart disease Father   . Breast cancer Paternal Aunt        54s  . Breast cancer Paternal Grandmother        great grandmother    SOCIAL HX:  reports that she has never smoked.  She has never used smokeless tobacco. She reports that she does not drink alcohol or use drugs.   Current Outpatient Medications:  .  aspirin 81 MG tablet, Take 81 mg by mouth daily., Disp: , Rfl:  .  calcium carbonate (OS-CAL) 600 MG TABS tablet, Take 1,200 mg by mouth daily., Disp: , Rfl:  .  Multiple Vitamin (MULTIVITAMIN) capsule, Take 1 capsule by mouth daily., Disp: , Rfl:  .  Multiple Vitamins-Minerals (RA VISION-VITE PRESERVE PO), Take 1 tablet by mouth daily., Disp: , Rfl:  .  Omega-3 Fatty Acids (FISH OIL) 300 MG CAPS, Take 1 capsule by mouth daily., Disp: , Rfl:  .  omeprazole (PRILOSEC) 20 MG capsule, TAKE 1 CAPSULE(20 MG) BY MOUTH EVERY  MORNING, Disp: 90 capsule, Rfl: 1 .  TURMERIC PO, Take 1 capsule by mouth daily., Disp: , Rfl:   EXAM:    General impression: alert, cooperative and articulate.  No signs of being in distress  Lungs: speech is fluent sentence length suggests that patient is not short of breath and not punctuated by cough, sneezing or sniffing. [name]   Psych: affect normal.  speech is articulate and non pressured .  Denies suicidal thoughts    ASSESSMENT AND PLAN:  Discussed the following assessment and plan:  Prediabetes - Plan: Hemoglobin A1c, Comprehensive metabolic panel, Lipid panel  Chronic insomnia  Prediabetes Her  random glucose is not  elevated but her A1c suggests she is at risk for developing diabetes.  I recommend she follow a low glycemic index diet (which she is already doing) and participate regularly in an aerobic  exercise activity (which she is not doing).  We should check an A1c in 6 months.   Lab Results  Component Value Date   HGBA1C 6.2 02/10/2019      Chronic insomnia She remains successfully weaned from  all hypnotics    I discussed the assessment and treatment plan with the patient. The patient was provided an opportunity to ask questions and all were answered. The patient agreed with the plan and demonstrated an understanding of the instructions.   The patient was advised to call back or seek an in-person evaluation if the symptoms worsen or if the condition fails to improve as anticipated.  I provided  25 minutes of non-face-to-face time during this encounter reviewing patient's current problems and past procedures/imaging studies, providing counseling on the above mentioned problems , and coordination  of care .   Crecencio Mc, MD

## 2019-02-12 NOTE — Assessment & Plan Note (Signed)
She remains successfully weaned from  all hypnotics

## 2019-02-12 NOTE — Assessment & Plan Note (Addendum)
Her  random glucose is not  elevated but her A1c suggests she is at risk for developing diabetes.  I recommend she follow a low glycemic index diet (which she is already doing) and participate regularly in an aerobic  exercise activity (which she is not doing).  We should check an A1c in 6 months.   Lab Results  Component Value Date   HGBA1C 6.2 02/10/2019

## 2019-04-03 ENCOUNTER — Other Ambulatory Visit: Payer: Self-pay

## 2019-04-03 MED ORDER — OMEPRAZOLE 20 MG PO CPDR: DELAYED_RELEASE_CAPSULE | ORAL | 1 refills | Status: DC

## 2019-07-09 ENCOUNTER — Telehealth: Payer: Self-pay | Admitting: Internal Medicine

## 2019-07-09 NOTE — Telephone Encounter (Signed)
Phone not in service. Pt due to schedule Medicare Annual Wellness Visit (AWV) either virtually or audio only.  Last AWV 01/14/18; please schedule at anytime with Denisa O'Brien-Blaney at [facility].

## 2019-09-30 ENCOUNTER — Other Ambulatory Visit: Payer: Self-pay | Admitting: Internal Medicine

## 2019-10-09 ENCOUNTER — Telehealth: Payer: Self-pay | Admitting: Internal Medicine

## 2019-10-09 NOTE — Telephone Encounter (Signed)
NUMBER NOT IN SERVICE. Pt due to schedule Medicare Annual Wellness Visit (AWV)   This should be a telephone visit only=30 minutes.  Last AWV 01/14/18; please schedule at anytime with Denisa O'Brien-Blaney at [facility].

## 2020-01-13 NOTE — Telephone Encounter (Signed)
LMTCB

## 2020-01-15 NOTE — Telephone Encounter (Signed)
See telephone encounter in pt's chart.

## 2020-01-28 ENCOUNTER — Telehealth: Payer: Self-pay | Admitting: Internal Medicine

## 2020-01-28 DIAGNOSIS — R5383 Other fatigue: Secondary | ICD-10-CM

## 2020-01-28 DIAGNOSIS — R7303 Prediabetes: Secondary | ICD-10-CM

## 2020-01-28 DIAGNOSIS — Z79899 Other long term (current) drug therapy: Secondary | ICD-10-CM

## 2020-01-28 DIAGNOSIS — E7849 Other hyperlipidemia: Secondary | ICD-10-CM

## 2020-01-28 NOTE — Telephone Encounter (Signed)
Patient called in set up physical for 03-05-2020 and she wants to get labs before physical need order

## 2020-01-29 NOTE — Telephone Encounter (Signed)
Pt would like to have labs done prior to her appt on 03/06/2019. I have ordered A1c, TSH, CBC, CMP, and lipid panel. Is there anything else that needs to be ordered?

## 2020-03-03 ENCOUNTER — Other Ambulatory Visit (INDEPENDENT_AMBULATORY_CARE_PROVIDER_SITE_OTHER): Payer: PPO

## 2020-03-03 ENCOUNTER — Other Ambulatory Visit: Payer: Self-pay

## 2020-03-03 DIAGNOSIS — Z79899 Other long term (current) drug therapy: Secondary | ICD-10-CM | POA: Diagnosis not present

## 2020-03-03 DIAGNOSIS — R7303 Prediabetes: Secondary | ICD-10-CM | POA: Diagnosis not present

## 2020-03-03 DIAGNOSIS — E7849 Other hyperlipidemia: Secondary | ICD-10-CM | POA: Diagnosis not present

## 2020-03-03 DIAGNOSIS — R5383 Other fatigue: Secondary | ICD-10-CM

## 2020-03-03 LAB — CBC WITH DIFFERENTIAL/PLATELET
Basophils Absolute: 0 10*3/uL (ref 0.0–0.1)
Basophils Relative: 0.8 % (ref 0.0–3.0)
Eosinophils Absolute: 0.2 10*3/uL (ref 0.0–0.7)
Eosinophils Relative: 2.8 % (ref 0.0–5.0)
HCT: 41.4 % (ref 36.0–46.0)
Hemoglobin: 13.7 g/dL (ref 12.0–15.0)
Lymphocytes Relative: 31.6 % (ref 12.0–46.0)
Lymphs Abs: 1.8 10*3/uL (ref 0.7–4.0)
MCHC: 33.1 g/dL (ref 30.0–36.0)
MCV: 88.1 fl (ref 78.0–100.0)
Monocytes Absolute: 0.7 10*3/uL (ref 0.1–1.0)
Monocytes Relative: 11.8 % (ref 3.0–12.0)
Neutro Abs: 3.1 10*3/uL (ref 1.4–7.7)
Neutrophils Relative %: 53 % (ref 43.0–77.0)
Platelets: 290 10*3/uL (ref 150.0–400.0)
RBC: 4.69 Mil/uL (ref 3.87–5.11)
RDW: 14.1 % (ref 11.5–15.5)
WBC: 5.8 10*3/uL (ref 4.0–10.5)

## 2020-03-03 LAB — COMPREHENSIVE METABOLIC PANEL
ALT: 17 U/L (ref 0–35)
AST: 16 U/L (ref 0–37)
Albumin: 4.2 g/dL (ref 3.5–5.2)
Alkaline Phosphatase: 74 U/L (ref 39–117)
BUN: 16 mg/dL (ref 6–23)
CO2: 29 mEq/L (ref 19–32)
Calcium: 9.5 mg/dL (ref 8.4–10.5)
Chloride: 103 mEq/L (ref 96–112)
Creatinine, Ser: 0.67 mg/dL (ref 0.40–1.20)
GFR: 79.57 mL/min (ref >60.00)
Glucose, Bld: 91 mg/dL (ref 70–99)
Potassium: 4.1 mEq/L (ref 3.5–5.1)
Sodium: 139 mEq/L (ref 135–145)
Total Bilirubin: 0.4 mg/dL (ref 0.2–1.2)
Total Protein: 7.2 g/dL (ref 6.0–8.3)

## 2020-03-03 LAB — HEMOGLOBIN A1C: Hgb A1c MFr Bld: 6.2 % (ref 4.6–6.5)

## 2020-03-03 LAB — TSH: TSH: 3.61 u[IU]/mL (ref 0.35–4.50)

## 2020-03-03 LAB — LIPID PANEL
Cholesterol: 238 mg/dL — ABNORMAL HIGH (ref 0–200)
HDL: 84 mg/dL (ref >39.00)
LDL Cholesterol: 128 mg/dL — ABNORMAL HIGH (ref 0–99)
NonHDL: 154.21
Total CHOL/HDL Ratio: 3
Triglycerides: 131 mg/dL (ref 0.0–149.0)
VLDL: 26.2 mg/dL (ref 0.0–40.0)

## 2020-03-05 ENCOUNTER — Encounter: Payer: Self-pay | Admitting: Internal Medicine

## 2020-03-05 ENCOUNTER — Ambulatory Visit (INDEPENDENT_AMBULATORY_CARE_PROVIDER_SITE_OTHER): Payer: PPO | Admitting: Internal Medicine

## 2020-03-05 ENCOUNTER — Other Ambulatory Visit: Payer: Self-pay

## 2020-03-05 VITALS — BP 136/80 | HR 78 | Temp 97.6°F | Ht 63.0 in | Wt 191.0 lb

## 2020-03-05 DIAGNOSIS — H6123 Impacted cerumen, bilateral: Secondary | ICD-10-CM | POA: Diagnosis not present

## 2020-03-05 DIAGNOSIS — R7303 Prediabetes: Secondary | ICD-10-CM | POA: Diagnosis not present

## 2020-03-05 DIAGNOSIS — Z853 Personal history of malignant neoplasm of breast: Secondary | ICD-10-CM | POA: Diagnosis not present

## 2020-03-05 DIAGNOSIS — Z299 Encounter for prophylactic measures, unspecified: Secondary | ICD-10-CM | POA: Diagnosis not present

## 2020-03-05 MED ORDER — ZOSTER VAC RECOMB ADJUVANTED 50 MCG/0.5ML IM SUSR: 0.5000 mL | Freq: Once | INTRAMUSCULAR | 1 refills | Status: AC

## 2020-03-05 MED ORDER — ZOLPIDEM TARTRATE 5 MG PO TABS: 5.0000 mg | ORAL_TABLET | Freq: Every evening | ORAL | 1 refills | Status: DC | PRN

## 2020-03-05 NOTE — Patient Instructions (Addendum)
I recommend trying melatonin for your insomnia.  It is not a sedative,  But must be taken on  a regular basis to help your internal clock.  Take every evening after dinner start with 3 mg dose   Max effective dose is 10 mg  You can also try using " Headspace".  This  is a phone app that teaches you to meditate and empty your mind of thoughts that interfere with space    Your CBC, thyroid ,cholesterol,  liver and kidney function are EXCELLENT    You are not anemic, but you remain at risk for diabetes .     Your annual mammogram has been ordered.  You are encouraged (required) to call to make your appointment at [facility]   Please schedule  a RN visit for an ear cleaning .  You may be able to facilitate the cleaning by using Debrox the night before

## 2020-03-05 NOTE — Assessment & Plan Note (Signed)
Bilateral  Needs RN visit for irrigation

## 2020-03-05 NOTE — Progress Notes (Signed)
Patient ID: Brianna Perry, female    DOB: [birth year]  Age: 87 y.o. MRN: [id number]  The patient is here for annual preventive examination and management of other chronic and acute problems.   The risk factors are reflected in the social history.  The roster of all physicians providing medical care to patient - is listed in the Snapshot section of the chart.  Activities of daily living:  The patient is 100% independent in all ADLs: dressing, toileting, feeding as well as independent mobility  Home safety : The patient has smoke detectors in the home. They wear seatbelts.  There are no firearms at home. There is no violence in the home.   There is no risks for hepatitis, STDs or HIV. There is no   history of blood transfusion. They have no travel history to infectious disease endemic areas of the world.  The patient has seen their dentist in the last six month. They have seen their eye doctor in the last year. They admit to slight hearing difficulty with regard to whispered voices and some television programs.  They have deferred audiologic testing in the last year.  They do not  have excessive sun exposure. Discussed the need for sun protection: hats, long sleeves and use of sunscreen if there is significant sun exposure.   Diet: the importance of a healthy diet is discussed. They do have a healthy diet.  The benefits of regular aerobic exercise were discussed. She walks 4 times per week ,  20 minutes.   Depression screen: there are no signs or vegative symptoms of depression- irritability, change in appetite, anhedonia, sadness/tearfullness.  Cognitive assessment: the patient manages all their financial and personal affairs and is actively engaged. They could relate day,date,year and events; recalled 2/3 objects at 3 minutes; performed clock-face test normally.  The following portions of the patient's history were reviewed and updated as appropriate: allergies, current medications, past  family history, past medical history,  past surgical history, past social history  and problem list.  Visual acuity was not assessed per patient preference since she has regular follow up with her ophthalmologist. Hearing and body mass index were assessed and reviewed.   During the course of the visit the patient was educated and counseled about appropriate screening and preventive services including : fall prevention , diabetes screening, nutrition counseling, colorectal cancer screening, and recommended immunizations.    CC: Diagnoses of Bilateral impacted cerumen, Personal history of breast cancer, Prediabetes, and Encounter for preventive measure were pertinent to this visit.   1) occasional insomnia  Occurring 2-3 /week  Wide awake until 2 am . Has tried melatonin at bedtime   History Brianna Perry has a past medical history of Arthritis, Breast cancer ([place]) (2010), Breast mass (x couple months), Cancer ([place]) (2010), Chickenpox, GERD (gastroesophageal reflux disease), Personal history of radiation therapy, and Urine incontinence.   She has a past surgical history that includes Abdominal hysterectomy (1968); Tonsillectomy (1941); Colonoscopy; Eye surgery; Tonsillectomy; Colonoscopy with propofol (N/A, 03/23/2015); Breast surgery (Right, 2010); Breast cyst aspiration (Left, 2015); Breast cyst aspiration (Left, 02/2016); Breast lumpectomy (Right, 2005); and Augmentation mammaplasty (Bilateral, 1973).   Her family history includes Arthritis in her mother; Breast cancer in her paternal aunt and paternal grandmother; Heart disease in her father and mother; Hypertension in her mother.She reports that she has never smoked. She has never used smokeless tobacco. She reports that she does not drink alcohol and does not use drugs.  Outpatient Medications Prior to Visit  Medication Sig Dispense Refill  . Apoaequorin (PREVAGEN) 10 MG CAPS Take by mouth.    [name] aspirin 81 MG tablet Take 81 mg by mouth daily.    .  calcium carbonate (OS-CAL) 600 MG TABS tablet Take 1,200 mg by mouth daily.    . Multiple Vitamin (MULTIVITAMIN) capsule Take 1 capsule by mouth daily.    . Multiple Vitamins-Minerals (RA VISION-VITE PRESERVE PO) Take 1 tablet by mouth daily.    . Omega-3 Fatty Acids (FISH OIL) 300 MG CAPS Take 1 capsule by mouth daily.    [name] omeprazole (PRILOSEC) 20 MG capsule TAKE 1 CAPSULE(20 MG) BY MOUTH EVERY MORNING 90 capsule 1  . TURMERIC PO Take 1 capsule by mouth daily.     No facility-administered medications prior to visit.    Review of Systems   Patient denies headache, fevers, malaise, unintentional weight loss, skin rash, eye pain, sinus congestion and sinus pain, sore throat, dysphagia,  hemoptysis , cough, dyspnea, wheezing, chest pain, palpitations, orthopnea, edema, abdominal pain, nausea, melena, diarrhea, constipation, flank pain, dysuria, hematuria, urinary  Frequency, nocturia, numbness, tingling, seizures,  Focal weakness, Loss of consciousness,  Tremor, insomnia, depression, anxiety, and suicidal ideation.     Objective:  BP 136/80 (BP Location: Left Arm, Patient Position: Sitting, Cuff Size: Normal)   Pulse 78   Temp 97.6 F (36.4 C) (Oral)   Ht 5\' 3"  (1.6 m)   Wt 191 lb (86.6 kg)   SpO2 97%   BMI 33.83 kg/m   Physical Exam  General appearance: alert, cooperative and appears stated age Head: Normocephalic, without obvious abnormality, atraumatic Eyes: conjunctivae/corneas clear. PERRL, EOM's intact. Fundi benign. Ears: normal TM's and external ear canals both ears Nose: Nares normal. Septum midline. Mucosa normal. No drainage or sinus tenderness. Throat: lips, mucosa, and tongue normal; teeth and gums normal Neck: no adenopathy, no carotid bruit, no JVD, supple, symmetrical, trachea midline and thyroid not enlarged, symmetric, no tenderness/mass/nodules Lungs: clear to auscultation bilaterally Breasts: saline implants, right breast firm , normal appearance, no masses or  tenderness Heart: regular rate and rhythm, S1, S2 normal, no murmur, click, rub or gallop Abdomen: soft, non-tender; bowel sounds normal; no masses,  no organomegaly Extremities: extremities normal, atraumatic, no cyanosis or edema Pulses: 2+ and symmetric Skin: Skin color, texture, turgor normal. No rashes or lesions Neurologic: Alert and oriented X 3, normal strength and tone. Normal symmetric reflexes. Normal coordination and gait.     Assessment & Plan:   Problem List Items Addressed This Visit      Unprioritized   Cerumen impaction    Bilateral  Needs RN visit for irrigation       Encounter for preventive measure    age appropriate education and counseling updated, referrals for preventative services and immunizations addressed, dietary and smoking counseling addressed, most recent labs reviewed.  I have personally reviewed and have noted:  1) the patient's medical and social history 2) The pt's use of alcohol, tobacco, and illicit drugs 3) The patient's current medications and supplements 4) Functional ability including ADL's, fall risk, home safety risk, hearing and visual impairment 5) Diet and physical activities 6) Evidence for depression or mood disorder 7) The patient's height, weight, and BMI have been recorded in the chart  I have made referrals, and provided counseling and education based on review of the above      Personal history of breast cancer    S/p bilateral implants.  She is behind in annual mammograms  Prediabetes    Her  random glucose is not  elevated but her A1c has been elevated and stable but suggests she is at risk for developing diabetes.  I recommend she follow a low glycemic index diet (which she is already doing) and participate regularly in an aerobic  exercise activity (which she is not doing).    Lab Results  Component Value Date   HGBA1C 6.2 03/03/2020             I am having Brianna Perry. Dang start on zolpidem and Zoster  Vaccine Adjuvanted. I am also having her maintain her multivitamin, Fish Oil, calcium carbonate, aspirin, Multiple Vitamins-Minerals (RA VISION-VITE PRESERVE PO), TURMERIC PO, omeprazole, and Prevagen.  Meds ordered this encounter  Medications  . zolpidem (AMBIEN) 5 MG tablet    Sig: Take 1 tablet (5 mg total) by mouth at bedtime as needed for sleep.    Dispense:  15 tablet    Refill:  1  . Zoster Vaccine Adjuvanted [facility]) injection    Sig: Inject 0.5 mLs into the muscle once for 1 dose.    Dispense:  1 each    Refill:  1    There are no discontinued medications.  Follow-up: No follow-ups on file.   Crecencio Mc, MD

## 2020-03-07 NOTE — Assessment & Plan Note (Signed)
Her  random glucose is not  elevated but her A1c has been elevated and stable but suggests she is at risk for developing diabetes.  I recommend she follow a low glycemic index diet (which she is already doing) and participate regularly in an aerobic  exercise activity (which she is not doing).    Lab Results  Component Value Date   HGBA1C 6.2 03/03/2020

## 2020-03-07 NOTE — Assessment & Plan Note (Signed)

## 2020-03-07 NOTE — Assessment & Plan Note (Signed)
S/p bilateral implants.  She is behind in annual mammograms

## 2020-03-10 ENCOUNTER — Ambulatory Visit: Payer: PPO

## 2020-03-11 ENCOUNTER — Ambulatory Visit (INDEPENDENT_AMBULATORY_CARE_PROVIDER_SITE_OTHER): Payer: PPO

## 2020-03-11 ENCOUNTER — Other Ambulatory Visit: Payer: Self-pay

## 2020-03-11 DIAGNOSIS — H6123 Impacted cerumen, bilateral: Secondary | ICD-10-CM | POA: Diagnosis not present

## 2020-03-11 NOTE — Progress Notes (Addendum)
Patient presented for ear irrigation due to cerumen impaction. Patient was informed of the possible side effects of having their ear flushed; light headedness, dizziness, nausea, vomiting and rupture ear drum. Items used or that can be used are the elephant pump, catch basin, ear curettes, hydrogen peroxide (half a bottle for ear irrigation solution), and a stool softener (1-2CC for softening ear wax).  Patient has given a verbal consent to have ear irrigation. patient voiced no concerns nor showed any signs of distress during procedure. Ear canals have become visibly clear.   I have reviewed the above information and agree with above.   Deborra Medina, MD

## 2020-03-12 ENCOUNTER — Other Ambulatory Visit: Payer: Self-pay | Admitting: Internal Medicine

## 2020-03-12 DIAGNOSIS — Z1231 Encounter for screening mammogram for malignant neoplasm of breast: Secondary | ICD-10-CM

## 2020-03-23 ENCOUNTER — Encounter: Payer: Self-pay | Admitting: *Deleted

## 2020-03-25 ENCOUNTER — Other Ambulatory Visit: Payer: Self-pay | Admitting: Internal Medicine

## 2020-04-14 ENCOUNTER — Ambulatory Visit
Admission: RE | Admit: 2020-04-14 | Discharge: 2020-04-14 | Disposition: A | Discharge status: Home / Self Care | Payer: PPO | Source: Ambulatory Visit | Attending: Internal Medicine | Admitting: Internal Medicine

## 2020-04-14 ENCOUNTER — Other Ambulatory Visit: Payer: Self-pay

## 2020-04-14 DIAGNOSIS — Z1231 Encounter for screening mammogram for malignant neoplasm of breast: Secondary | ICD-10-CM | POA: Diagnosis not present

## 2020-04-29 ENCOUNTER — Ambulatory Visit: Payer: PPO

## 2020-05-03 ENCOUNTER — Ambulatory Visit (INDEPENDENT_AMBULATORY_CARE_PROVIDER_SITE_OTHER): Payer: PPO

## 2020-05-03 VITALS — Ht 63.0 in | Wt 191.0 lb

## 2020-05-03 DIAGNOSIS — Z0000 Encounter for general adult medical examination without abnormal findings: Secondary | ICD-10-CM

## 2020-05-03 NOTE — Progress Notes (Addendum)
Subjective:   Brianna Perry is a 87 y.o. female who presents for Medicare Annual (Subsequent) preventive examination.  Review of Systems    No ROS.  Medicare Wellness Virtual Visit.    Cardiac Risk Factors include: advanced age (>65men, >44 women)     Objective:    Today's Vitals   05/03/20 1111  Weight: 191 lb (86.6 kg)  Height: 5\' 3"  (1.6 m)   Body mass index is 33.83 kg/m.  Advanced Directives 05/03/2020 01/14/2018 01/11/2017 01/04/2016  Does Patient Have a Medical Advance Directive? Yes Yes Yes No  Type of Paramedic of [place];Living will [place];Living will Living will;Healthcare Power of Attorney -  Does patient want to make changes to medical advance directive? No - Patient declined No - Patient declined No - Patient declined -  Copy of [place] in Chart? Yes - validated most recent copy scanned in chart (See row information) Yes - validated most recent copy scanned in chart (See row information) No - copy requested -  Would patient like information on creating a medical advance directive? - - - Yes - Educational materials given    Current Medications (verified) Outpatient Encounter Medications as of 05/03/2020  Medication Sig   Apoaequorin (PREVAGEN) 10 MG CAPS Take by mouth.   aspirin 81 MG tablet Take 81 mg by mouth daily.   calcium carbonate (OS-CAL) 600 MG TABS tablet Take 1,200 mg by mouth daily.   Multiple Vitamin (MULTIVITAMIN) capsule Take 1 capsule by mouth daily.   Multiple Vitamins-Minerals (RA VISION-VITE PRESERVE PO) Take 1 tablet by mouth daily.   Omega-3 Fatty Acids (FISH OIL) 300 MG CAPS Take 1 capsule by mouth daily.   omeprazole (PRILOSEC) 20 MG capsule TAKE 1 CAPSULE(20 MG) BY MOUTH EVERY MORNING   TURMERIC PO Take 1 capsule by mouth daily.   zolpidem (AMBIEN) 5 MG tablet Take 1 tablet (5 mg total) by mouth at bedtime as needed for sleep.   No facility-administered encounter  medications on file as of 05/03/2020.    Allergies (verified) Penicillins   History: Past Medical History:  Diagnosis Date   Arthritis    Breast cancer ([place]) 2010   DCIS; Right Breast c lumpectomy with 10 weeks radiation   Breast mass x couple months   Right Breast   Cancer ([place]) 2010   Right breast   Chickenpox    GERD (gastroesophageal reflux disease)    Personal history of radiation therapy    Urine incontinence    Past Surgical History:  Procedure Laterality Date   ABDOMINAL HYSTERECTOMY  1968   AUGMENTATION MAMMAPLASTY Bilateral 6578   silicone/[place]   BREAST CYST ASPIRATION Left 2015   Negative   BREAST CYST ASPIRATION Left 02/2016   aspiration cyst LEFT breast   BREAST LUMPECTOMY Right 2010   BREAST SURGERY Right 2010   Dr Clide [place] in [place]   COLONOSCOPY WITH PROPOFOL N/A 03/23/2015   Procedure: COLONOSCOPY WITH PROPOFOL;  Surgeon: [name], MD;  Location: [facility] ENDOSCOPY;  Service: Endoscopy;  Laterality: N/A;   EYE SURGERY     TONSILLECTOMY  1941   TONSILLECTOMY     Family History  Problem Relation Age of Onset   Arthritis Mother    Heart disease Mother    Hypertension Mother    Heart disease Father    Breast cancer Paternal Aunt        36s  Breast cancer Paternal Grandmother        great grandmother   Social History   Socioeconomic History   Marital status: Married    Spouse name: Not on file   Number of children: Not on file   Years of education: Not on file   Highest education level: Not on file  Occupational History   Not on file  Tobacco Use   Smoking status: Never Smoker   Smokeless tobacco: Never Used  Vaping Use   Vaping Use: Never used  Substance and Sexual Activity   Alcohol use: No   Drug use: No   Sexual activity: Never  Other Topics Concern   Not on file  Social History Narrative   Not on file   Social Determinants of Health   Financial Resource Strain: Low Risk     Difficulty of Paying Living Expenses: Not hard at all  Food Insecurity: No Food Insecurity   Worried About Charity fundraiser in the Last Year: Never true   [place] in the Last Year: Never true  Transportation Needs: No Transportation Needs   Lack of Transportation (Medical): No   Lack of Transportation (Non-Medical): No  Physical Activity: Insufficiently Active   Days of Exercise per Week: 1 day   Minutes of Exercise per Session: 20 min  Stress: No Stress Concern Present   Feeling of Stress : Not at all  Social Connections: Unknown   Frequency of Communication with Friends and Family: More than three times a week   Frequency of Social Gatherings with Friends and Family: More than three times a week   Attends Religious Services: Not on Electrical engineer or Organizations: Not on file   Attends Archivist Meetings: Not on file   Marital Status: Not on file    Tobacco Counseling Counseling given: Not Answered   Clinical Intake:  Pre-visit preparation completed: Yes        Diabetes: No  How often do you need to have someone help you when you read instructions, pamphlets, or other written materials from your doctor or pharmacy?: 1 - Never   Interpreter Needed?: No      Activities of Daily Living In your present state of health, do you have any difficulty performing the following activities: 05/03/2020  Hearing? N  Vision? N  Difficulty concentrating or making decisions? N  Walking or climbing stairs? N  Dressing or bathing? N  Doing errands, shopping? N  Preparing Food and eating ? N  Using the Toilet? N  In the past six months, have you accidently leaked urine? Y  Comment Managed with daily pads.  Do you have problems with loss of bowel control? N  Managing your Medications? N  Managing your Finances? N  Housekeeping or managing your Housekeeping? N  Some recent data might be hidden    Patient Care Team: Crecencio Mc, MD as  PCP - General (Internal Medicine) Crecencio Mc, MD (Internal Medicine) Bary Castilla Forest Gleason, MD (General Surgery)  Indicate any recent Medical Services you may have received from other than [facility] providers in the past year (date may be approximate).     Assessment:   This is a routine wellness examination for Brianna Perry.  I connected with Brianna Perry today by telephone and verified that I am speaking with the correct person using two identifiers. Location patient: home Location provider: work Persons participating in the virtual visit: patient, Marine scientist.    I  discussed the limitations, risks, security and privacy concerns of performing an evaluation and management service by telephone and the availability of in person appointments. The patient expressed understanding and verbally consented to this telephonic visit.    Interactive audio and video telecommunications were attempted between this provider and patient, however failed, due to patient having technical difficulties OR patient did not have access to video capability.  We continued and completed visit with audio only.  Some vital signs may be absent or patient reported.   Hearing/Vision screen  Hearing Screening   125Hz  250Hz  500Hz  1000Hz  2000Hz  3000Hz  4000Hz  6000Hz  8000Hz   Right ear:           Left ear:           Comments: Patient is able to hear conversational tones without difficulty. No issues reported.   Vision Screening Comments: Cataract extraction, bilateral  Wears reading glasses only  Followed by [facility]  Dietary issues and exercise activities discussed: Current Exercise Habits: Home exercise routine, Type of exercise: walking, Intensity: Mild  Healthy diet Good water intake  Goals       Patient Stated     Weight (lb) < 200 lb (90.7 kg) (pt-stated)      Lose 20lbs Portion controlled, low carb diet Increase exercise 150 minutes per week       Depression Screen PHQ 2/9 Scores 05/03/2020 03/05/2020 02/12/2019  01/14/2018 01/11/2017 01/04/2016 12/29/2015  PHQ - 2 Score 0 0 0 0 0 0 0  PHQ- 9 Score 0 2 0 - 0 - -    Fall Risk Fall Risk  05/03/2020 03/05/2020 02/12/2019 01/14/2018 01/11/2017  Falls in the past year? 0 0 0 0 No  Number falls in past yr: 0 0 - - -  Injury with Fall? 0 0 - - -  Follow up Falls evaluation completed Falls evaluation completed Falls evaluation completed - -    FALL RISK PREVENTION PERTAINING TO THE HOME: Any stairs in or around the home? Yes  Home free of loose throw rugs in walkways, pet beds, electrical cords, etc? Yes  Adequate lighting in your home to reduce risk of falls? Yes   ASSISTIVE DEVICES UTILIZED TO PREVENT FALLS: Life alert? No  Use of a cane, walker or w/c? No   TIMED UP AND GO: Was the test performed? No . Virtual visit.  Cognitive Function: Patient is alert and oriented x3.  Enjoys reading and completing puzzles for brain health.  Denies difficulty focusing, making decisions, memory loss.  MMSE/6CIT deferred. Normal by communication/observation.   MMSE - Mini Mental State Exam 01/11/2017 01/04/2016  Orientation to time 5 5  Orientation to Place 5 5  Registration 3 3  Attention/ Calculation 5 5  Recall 3 3  Language- name 2 objects 2 2  Language- repeat 1 1  Language- follow 3 step command 3 3  Language- read & follow direction 1 1  Write a sentence 1 1  Copy design 1 1  Total score 30 30     6CIT Screen 01/14/2018  What Year? 0 points  What month? 0 points  What time? 0 points  Count back from 20 0 points  Months in reverse 0 points  Repeat phrase 0 points  Total Score 0    Immunizations Immunization History  Administered Date(s) Administered   Influenza,inj,Quad PF,6+ Mos 11/27/2012   Influenza-Unspecified 12/01/2013, 12/17/2014, 12/23/2015, 12/27/2017, 12/17/2018, 12/26/2019   Moderna Sars-Covid-2 Vaccination 03/11/2019, 04/08/2019, 12/29/2019   Pneumococcal Conjugate-13 12/22/2013   Pneumococcal  Polysaccharide-23 11/27/2009,  12/28/2014   Tdap 07/19/2013   Zoster 11/28/2010   Health Maintenance There are no preventive care reminders to display for this patient. Health Maintenance  Topic Date Due   COVID-19 Vaccine (4 - Booster for Moderna series) 06/27/2020   MAMMOGRAM  04/14/2021   TETANUS/TDAP  07/20/2023   INFLUENZA VACCINE  Completed   DEXA SCAN  Completed   PNA vac Low Risk Adult  Completed   HPV VACCINES  Aged Out   Colorectal cancer screening: No longer required.   Mammogram status: Completed 04/14/20. Repeat every year  Lung Cancer Screening: (Low Dose CT Chest recommended if Age 27-80 years, 30 pack-year currently smoking OR have quit w/in 15years.) does not qualify.   Vision Screening: Recommended annual ophthalmology exams for early detection of glaucoma and other disorders of the eye. Is the patient up to date with their annual eye exam?  Yes   Dental Screening: Recommended annual dental exams for proper oral hygiene.  Community Resource Referral / Chronic Care Management: CRR required this visit?  No   CCM required this visit?  No      Plan:   Keep all routine maintenance appointments.   Cpe 03/30/21 @ 8:30  I have personally reviewed and noted the following in the patient's chart:   Medical and social history Use of alcohol, tobacco or illicit drugs  Current medications and supplements Functional ability and status Nutritional status Physical activity Advanced directives List of other physicians Hospitalizations, surgeries, and ER visits in previous 12 months Vitals Screenings to include cognitive, depression, and falls Referrals and appointments  In addition, I have reviewed and discussed with patient certain preventive protocols, quality metrics, and best practice recommendations. A written personalized care plan for preventive services as well as general preventive health recommendations were provided to patient via mychart.     OBrien-Blaney, Denisa L,  LPN   03/02/2393      I have reviewed the above information and agree with above.   Deborra Medina, MD

## 2020-05-03 NOTE — Patient Instructions (Addendum)
Brianna Perry , Thank you for taking time to come for your Medicare Wellness Visit. I appreciate your ongoing commitment to your health goals. Please review the following plan we discussed and let me know if I can assist you in the future.   These are the goals we discussed: Goals      Patient Stated   .  Weight (lb) < 200 lb (90.7 kg) (pt-stated)      Lose 20lbs Portion controlled, low carb diet Increase exercise 150 minutes per week       This is a list of the screening recommended for you and due dates:  Health Maintenance  Topic Date Due  . COVID-19 Vaccine (4 - Booster for Moderna series) 06/27/2020  . Mammogram  04/14/2021  . Tetanus Vaccine  07/20/2023  . Flu Shot  Completed  . DEXA scan (bone density measurement)  Completed  . Pneumonia vaccines  Completed  . HPV Vaccine  Aged Out    Immunizations Immunization History  Administered Date(s) Administered  . Influenza,inj,Quad PF,6+ Mos 11/27/2012  . Influenza-Unspecified 12/01/2013, 12/17/2014, 12/23/2015, 12/27/2017, 12/17/2018, 12/26/2019  . Moderna Sars-Covid-2 Vaccination 03/11/2019, 04/08/2019, 12/29/2019  . Pneumococcal Conjugate-13 12/22/2013  . Pneumococcal Polysaccharide-23 11/27/2009, 12/28/2014  . Tdap 07/19/2013  . Zoster 11/28/2010   Keep all routine maintenance appointments.   Cpe 03/30/21 @ 8:30  Advanced directives: on file  Conditions/risks identified: none new  Follow up in one year for your annual wellness visit.   Preventive Care 29 Years and Older, Female Preventive care refers to lifestyle choices and visits with your health care provider that can promote health and wellness. What does preventive care include?  A yearly physical exam. This is also called an annual well check.  Dental exams once or twice a year.  Routine eye exams. Ask your health care provider how often you should have your eyes checked.  Personal lifestyle choices, including:  Daily care of your teeth and  gums.  Regular physical activity.  Eating a healthy diet.  Avoiding tobacco and drug use.  Limiting alcohol use.  Practicing safe sex.  Taking low-dose aspirin every day.  Taking vitamin and mineral supplements as recommended by your health care provider. What happens during an annual well check? The services and screenings done by your health care provider during your annual well check will depend on your age, overall health, lifestyle risk factors, and family history of disease. Counseling  Your health care provider may ask you questions about your:  Alcohol use.  Tobacco use.  Drug use.  Emotional well-being.  Home and relationship well-being.  Sexual activity.  Eating habits.  History of falls.  Memory and ability to understand (cognition).  Work and work Statistician.  Reproductive health. Screening  You may have the following tests or measurements:  Height, weight, and BMI.  Blood pressure.  Lipid and cholesterol levels. These may be checked every 5 years, or more frequently if you are over 49 years old.  Skin check.  Lung cancer screening. You may have this screening every year starting at age 52 if you have a 30-pack-year history of smoking and currently smoke or have quit within the past 15 years.  Fecal occult blood test (FOBT) of the stool. You may have this test every year starting at age 71.  Flexible sigmoidoscopy or colonoscopy. You may have a sigmoidoscopy every 5 years or a colonoscopy every 10 years starting at age 38.  Hepatitis C blood test.  Hepatitis B blood test.  Sexually transmitted disease (STD) testing.  Diabetes screening. This is done by checking your blood sugar (glucose) after you have not eaten for a while (fasting). You may have this done every 1-3 years.  Bone density scan. This is done to screen for osteoporosis. You may have this done starting at age 35.  Mammogram. This may be done every 1-2 years. Talk to your  health care provider about how often you should have regular mammograms. Talk with your health care provider about your test results, treatment options, and if necessary, the need for more tests. Vaccines  Your health care provider may recommend certain vaccines, such as:  Influenza vaccine. This is recommended every year.  Tetanus, diphtheria, and acellular pertussis (Tdap, Td) vaccine. You may need a Td booster every 10 years.  Zoster vaccine. You may need this after age 27.  Pneumococcal 13-valent conjugate (PCV13) vaccine. One dose is recommended after age 71.  Pneumococcal polysaccharide (PPSV23) vaccine. One dose is recommended after age 31. Talk to your health care provider about which screenings and vaccines you need and how often you need them. This information is not intended to replace advice given to you by your health care provider. Make sure you discuss any questions you have with your health care provider. Document Released: 03/12/2015 Document Revised: 11/03/2015 Document Reviewed: 12/15/2014 Elsevier Interactive Patient Education  2017 [employer] Prevention in the Home Falls can cause injuries. They can happen to people of all ages. There are many things you can do to make your home safe and to help prevent falls. What can I do on the outside of my home?  Regularly fix the edges of walkways and driveways and fix any cracks.  Remove anything that might make you trip as you walk through a door, such as a raised step or threshold.  Trim any bushes or trees on the path to your home.  Use bright outdoor lighting.  Clear any walking paths of anything that might make someone trip, such as rocks or tools.  Regularly check to see if handrails are loose or broken. Make sure that both sides of any steps have handrails.  Any raised decks and porches should have guardrails on the edges.  Have any leaves, snow, or ice cleared regularly.  Use sand or salt on walking  paths during winter.  Clean up any spills in your garage right away. This includes oil or grease spills. What can I do in the bathroom?  Use night lights.  Install grab bars by the toilet and in the tub and shower. Do not use towel bars as grab bars.  Use non-skid mats or decals in the tub or shower.  If you need to sit down in the shower, use a plastic, non-slip stool.  Keep the floor dry. Clean up any water that spills on the floor as soon as it happens.  Remove soap buildup in the tub or shower regularly.  Attach bath mats securely with double-sided non-slip rug tape.  Do not have throw rugs and other things on the floor that can make you trip. What can I do in the bedroom?  Use night lights.  Make sure that you have a light by your bed that is easy to reach.  Do not use any sheets or blankets that are too big for your bed. They should not hang down onto the floor.  Have a firm chair that has side arms. You can use this for support while you get  dressed.  Do not have throw rugs and other things on the floor that can make you trip. What can I do in the kitchen?  Clean up any spills right away.  Avoid walking on wet floors.  Keep items that you use a lot in easy-to-reach places.  If you need to reach something above you, use a strong step stool that has a grab bar.  Keep electrical cords out of the way.  Do not use floor polish or wax that makes floors slippery. If you must use wax, use non-skid floor wax.  Do not have throw rugs and other things on the floor that can make you trip. What can I do with my stairs?  Do not leave any items on the stairs.  Make sure that there are handrails on both sides of the stairs and use them. Fix handrails that are broken or loose. Make sure that handrails are as long as the stairways.  Check any carpeting to make sure that it is firmly attached to the stairs. Fix any carpet that is loose or worn.  Avoid having throw rugs at  the top or bottom of the stairs. If you do have throw rugs, attach them to the floor with carpet tape.  Make sure that you have a light switch at the top of the stairs and the bottom of the stairs. If you do not have them, ask someone to add them for you. What else can I do to help prevent falls?  Wear shoes that:  Do not have high heels.  Have rubber bottoms.  Are comfortable and fit you well.  Are closed at the toe. Do not wear sandals.  If you use a stepladder:  Make sure that it is fully opened. Do not climb a closed stepladder.  Make sure that both sides of the stepladder are locked into place.  Ask someone to hold it for you, if possible.  Clearly mark and make sure that you can see:  Any grab bars or handrails.  First and last steps.  Where the edge of each step is.  Use tools that help you move around (mobility aids) if they are needed. These include:  Canes.  Walkers.  Scooters.  Crutches.  Turn on the lights when you go into a dark area. Replace any light bulbs as soon as they burn out.  Set up your furniture so you have a clear path. Avoid moving your furniture around.  If any of your floors are uneven, fix them.  If there are any pets around you, be aware of where they are.  Review your medicines with your doctor. Some medicines can make you feel dizzy. This can increase your chance of falling. Ask your doctor what other things that you can do to help prevent falls. This information is not intended to replace advice given to you by your health care provider. Make sure you discuss any questions you have with your health care provider. Document Released: 12/10/2008 Document Revised: 07/22/2015 Document Reviewed: 03/20/2014 Elsevier Interactive Patient Education  2017 [employer].

## 2020-09-08 ENCOUNTER — Telehealth: Payer: Self-pay

## 2020-09-08 ENCOUNTER — Other Ambulatory Visit: Payer: Self-pay | Admitting: Internal Medicine

## 2020-09-08 ENCOUNTER — Other Ambulatory Visit (INDEPENDENT_AMBULATORY_CARE_PROVIDER_SITE_OTHER): Payer: PPO

## 2020-09-08 DIAGNOSIS — R300 Dysuria: Secondary | ICD-10-CM

## 2020-09-08 DIAGNOSIS — N3090 Cystitis, unspecified without hematuria: Secondary | ICD-10-CM | POA: Insufficient documentation

## 2020-09-08 LAB — URINALYSIS, ROUTINE W REFLEX MICROSCOPIC
Bilirubin Urine: NEGATIVE
Ketones, ur: NEGATIVE
Nitrite: NEGATIVE
RBC / HPF: NONE SEEN (ref 0–?)
Specific Gravity, Urine: <=1.005 — AB (ref 1.000–1.030)
Total Protein, Urine: NEGATIVE
Urine Glucose: NEGATIVE
Urobilinogen, UA: 0.2 (ref 0.0–1.0)
pH: 6 (ref 5.0–8.0)

## 2020-09-08 MED ORDER — CIPROFLOXACIN HCL 250 MG PO TABS: 250.0000 mg | ORAL_TABLET | Freq: Two times a day (BID) | ORAL | 0 refills | Status: AC

## 2020-09-08 NOTE — Telephone Encounter (Signed)
Pt was in the office with husband and stated that she thinks she has a UTI. Dr. Derrel Nip gave a verbal to order both a UA w/micro and a urine culture.

## 2020-09-10 LAB — URINE CULTURE
MICRO NUMBER:: 12114428
SPECIMEN QUALITY:: ADEQUATE

## 2020-09-17 ENCOUNTER — Other Ambulatory Visit: Payer: Self-pay | Admitting: Internal Medicine

## 2020-10-29 DIAGNOSIS — H43813 Vitreous degeneration, bilateral: Secondary | ICD-10-CM | POA: Diagnosis not present

## 2020-12-09 ENCOUNTER — Other Ambulatory Visit: Payer: Self-pay | Admitting: Internal Medicine

## 2020-12-09 ENCOUNTER — Ambulatory Visit (INDEPENDENT_AMBULATORY_CARE_PROVIDER_SITE_OTHER): Payer: PPO

## 2020-12-09 DIAGNOSIS — Z23 Encounter for immunization: Secondary | ICD-10-CM

## 2020-12-09 MED ORDER — ZOLPIDEM TARTRATE 5 MG PO TABS: 5.0000 mg | ORAL_TABLET | Freq: Every evening | ORAL | 2 refills | Status: DC | PRN

## 2021-03-18 ENCOUNTER — Other Ambulatory Visit: Payer: Self-pay | Admitting: Internal Medicine

## 2021-03-30 ENCOUNTER — Encounter: Payer: Self-pay | Admitting: Internal Medicine

## 2021-03-30 ENCOUNTER — Ambulatory Visit (INDEPENDENT_AMBULATORY_CARE_PROVIDER_SITE_OTHER): Payer: PPO | Admitting: Internal Medicine

## 2021-03-30 ENCOUNTER — Other Ambulatory Visit: Payer: Self-pay

## 2021-03-30 VITALS — BP 140/70 | HR 75 | Temp 98.2°F | Ht 63.0 in | Wt 194.6 lb

## 2021-03-30 DIAGNOSIS — R7303 Prediabetes: Secondary | ICD-10-CM

## 2021-03-30 DIAGNOSIS — Z1231 Encounter for screening mammogram for malignant neoplasm of breast: Secondary | ICD-10-CM | POA: Diagnosis not present

## 2021-03-30 DIAGNOSIS — D126 Benign neoplasm of colon, unspecified: Secondary | ICD-10-CM | POA: Diagnosis not present

## 2021-03-30 DIAGNOSIS — Z79899 Other long term (current) drug therapy: Secondary | ICD-10-CM | POA: Diagnosis not present

## 2021-03-30 DIAGNOSIS — E785 Hyperlipidemia, unspecified: Secondary | ICD-10-CM | POA: Diagnosis not present

## 2021-03-30 DIAGNOSIS — I700 Atherosclerosis of aorta: Secondary | ICD-10-CM

## 2021-03-30 DIAGNOSIS — R5383 Other fatigue: Secondary | ICD-10-CM

## 2021-03-30 DIAGNOSIS — N6002 Solitary cyst of left breast: Secondary | ICD-10-CM

## 2021-03-30 DIAGNOSIS — Z6835 Body mass index (BMI) 35.0-35.9, adult: Secondary | ICD-10-CM | POA: Diagnosis not present

## 2021-03-30 DIAGNOSIS — E6609 Other obesity due to excess calories: Secondary | ICD-10-CM

## 2021-03-30 LAB — CBC WITH DIFFERENTIAL/PLATELET
Basophils Absolute: 0.1 10*3/uL (ref 0.0–0.1)
Basophils Relative: 0.9 % (ref 0.0–3.0)
Eosinophils Absolute: 0.1 10*3/uL (ref 0.0–0.7)
Eosinophils Relative: 1.5 % (ref 0.0–5.0)
HCT: 41.1 % (ref 36.0–46.0)
Hemoglobin: 13.5 g/dL (ref 12.0–15.0)
Lymphocytes Relative: 23 % (ref 12.0–46.0)
Lymphs Abs: 1.5 10*3/uL (ref 0.7–4.0)
MCHC: 32.9 g/dL (ref 30.0–36.0)
MCV: 88.3 fl (ref 78.0–100.0)
Monocytes Absolute: 0.5 10*3/uL (ref 0.1–1.0)
Monocytes Relative: 8.2 % (ref 3.0–12.0)
Neutro Abs: 4.3 10*3/uL (ref 1.4–7.7)
Neutrophils Relative %: 66.4 % (ref 43.0–77.0)
Platelets: 311 10*3/uL (ref 150.0–400.0)
RBC: 4.66 Mil/uL (ref 3.87–5.11)
RDW: 14.3 % (ref 11.5–15.5)
WBC: 6.4 10*3/uL (ref 4.0–10.5)

## 2021-03-30 LAB — COMPREHENSIVE METABOLIC PANEL
ALT: 16 U/L (ref 0–35)
AST: 17 U/L (ref 0–37)
Albumin: 4.1 g/dL (ref 3.5–5.2)
Alkaline Phosphatase: 75 U/L (ref 39–117)
BUN: 15 mg/dL (ref 6–23)
CO2: 31 mEq/L (ref 19–32)
Calcium: 9.7 mg/dL (ref 8.4–10.5)
Chloride: 101 mEq/L (ref 96–112)
Creatinine, Ser: 0.69 mg/dL (ref 0.40–1.20)
GFR: 78.41 mL/min (ref >60.00)
Glucose, Bld: 104 mg/dL — ABNORMAL HIGH (ref 70–99)
Potassium: 4.1 mEq/L (ref 3.5–5.1)
Sodium: 139 mEq/L (ref 135–145)
Total Bilirubin: 0.4 mg/dL (ref 0.2–1.2)
Total Protein: 7.2 g/dL (ref 6.0–8.3)

## 2021-03-30 LAB — LIPID PANEL
Cholesterol: 232 mg/dL — ABNORMAL HIGH (ref 0–200)
HDL: 77.3 mg/dL (ref >39.00)
LDL Cholesterol: 125 mg/dL — ABNORMAL HIGH (ref 0–99)
NonHDL: 154.66
Total CHOL/HDL Ratio: 3
Triglycerides: 148 mg/dL (ref 0.0–149.0)
VLDL: 29.6 mg/dL (ref 0.0–40.0)

## 2021-03-30 LAB — HEMOGLOBIN A1C: Hgb A1c MFr Bld: 6.2 % (ref 4.6–6.5)

## 2021-03-30 LAB — TSH: TSH: 2.48 u[IU]/mL (ref 0.35–5.50)

## 2021-03-30 NOTE — Patient Instructions (Signed)
Your diet is fine.  You need to burn calories to lose weight  Consider getting a stationery bike   Try Debrox for ear wax,  available OTC  Your annual mammogram has been ordered.  You are encouraged (required) to call to make your appointment at [place] [phone]

## 2021-03-30 NOTE — Assessment & Plan Note (Signed)
With prediabetes by 2022 labs.  Low GI diet and use of stationery bike for exercise recommended (given her husband's status)

## 2021-03-30 NOTE — Assessment & Plan Note (Signed)
S/p bilateral implants.  She is up to date on mammogams

## 2021-03-30 NOTE — Progress Notes (Addendum)
Patient ID: Brianna Perry, female    DOB: 09/09/1932  Age: 88 y.o. MRN: [id number]  The patient is here for FOLLOW UP  and management of other chronic and acute problems.   The risk factors are reflected in the social history.  The roster of all physicians providing medical care to patient - is listed in the Snapshot section of the chart.  Activities of daily living:  The patient is 100% independent in all ADLs: dressing, toileting, feeding as well as independent mobility  Home safety : The patient has smoke detectors in the home. They wear seatbelts.  There are no firearms at home. There is no violence in the home.   There is no risks for hepatitis, STDs or HIV. There is no   history of blood transfusion. They have no travel history to infectious disease endemic areas of the world.  The patient has seen their dentist in the last six month. They have seen their eye doctor in the last year. They admit to slight hearing difficulty with regard to whispered voices and some television programs.  They have deferred audiologic testing in the last year.  They do not  have excessive sun exposure. Discussed the need for sun protection: hats, long sleeves and use of sunscreen if there is significant sun exposure.   Diet: the importance of a healthy diet is discussed. They do have a healthy diet.  The benefits of regular aerobic exercise were discussed. She walks 4 times per week ,  20 minutes.   Depression screen: there are no signs or vegative symptoms of depression- irritability, change in appetite, anhedonia, sadness/tearfullness.  Cognitive assessment: the patient manages all their financial and personal affairs and is actively engaged. They could relate day,date,year and events; recalled 2/3 objects at 3 minutes; performed clock-face test normally.  The following portions of the patient's history were reviewed and updated as appropriate: allergies, current medications, past family history, past  medical history,  past surgical history, past social history  and problem list.  Visual acuity was not assessed per patient preference since she has regular follow up with her ophthalmologist. Hearing and body mass index were assessed and reviewed.   During the course of the visit the patient was educated and counseled about appropriate screening and preventive services including : fall prevention , diabetes screening, nutrition counseling, colorectal cancer screening, and recommended immunizations.    CC: The primary encounter diagnosis was Encounter for screening mammogram for malignant neoplasm of breast. Diagnoses of Long-term use of high-risk medication, Prediabetes, Hyperlipidemia, unspecified hyperlipidemia type, Other fatigue, Tubular adenoma of colon, Class 2 obesity due to excess calories without serious comorbidity with body mass index (BMI) of 35.0 to 35.9 in adult, Simple cyst of breast, left, and Aortic atherosclerosis ([place]) were also pertinent to this visit.  1) wants to lose weight.  Eats 2 meals per day,  biggest meal is mid day. Low glycemic index diet followed.  She is not  exercising.   2) aortic atherosclerosis:  Reviewed findings and significance of AA noted on 2017 chest  x ray today.[name]      History Brianna Perry has a past medical history of Arthritis, Breast cancer ([place]) (2010), Breast mass (x couple months), Cancer ([place]) (2010), Chickenpox, GERD (gastroesophageal reflux disease), Personal history of radiation therapy, and Urine incontinence.   She has a past surgical history that includes Abdominal hysterectomy (1968); Tonsillectomy (1941); Colonoscopy; Eye surgery; Tonsillectomy; Colonoscopy with propofol (N/A, 03/23/2015); Breast surgery (Right, 2010); Breast lumpectomy (Right,  2010); Augmentation mammaplasty (Bilateral, 1973); Breast cyst aspiration (Left, 2015); and Breast cyst aspiration (Left, 02/2016).   Her family history includes Arthritis in her mother; Breast cancer in her  paternal aunt and paternal grandmother; Heart disease in her father and mother; Hypertension in her mother.She reports that she has never smoked. She has never used smokeless tobacco. She reports that she does not drink alcohol and does not use drugs.  Outpatient Medications Prior to Visit  Medication Sig Dispense Refill   Apoaequorin (PREVAGEN) 10 MG CAPS Take by mouth.     aspirin 81 MG tablet Take 81 mg by mouth daily.     calcium carbonate (OS-CAL) 600 MG TABS tablet Take 1,200 mg by mouth daily.     Multiple Vitamin (MULTIVITAMIN) capsule Take 1 capsule by mouth daily.     Multiple Vitamins-Minerals (RA VISION-VITE PRESERVE PO) Take 1 tablet by mouth daily.     Omega-3 Fatty Acids (FISH OIL) 300 MG CAPS Take 1 capsule by mouth daily.     omeprazole (PRILOSEC) 20 MG capsule TAKE 1 CAPSULE(20 MG) BY MOUTH EVERY MORNING 90 capsule 1   Probiotic Product (PROBIOTIC-10 PO) Take 1 capsule by mouth daily.     TURMERIC PO Take 1 capsule by mouth daily.     zolpidem (AMBIEN) 5 MG tablet Take 1 tablet (5 mg total) by mouth at bedtime as needed for sleep. 15 tablet 2   No facility-administered medications prior to visit.    Review of Systems  Patient denies headache, fevers, malaise, unintentional weight loss, skin rash, eye pain, sinus congestion and sinus pain, sore throat, dysphagia,  hemoptysis , cough, dyspnea, wheezing, chest pain, palpitations, orthopnea, edema, abdominal pain, nausea, melena, diarrhea, constipation, flank pain, dysuria, hematuria, urinary  Frequency, nocturia, numbness, tingling, seizures,  Focal weakness, Loss of consciousness,  Tremor, insomnia, depression, anxiety, and suicidal ideation.     Objective:  BP 140/70 (BP Location: Left Arm, Patient Position: Sitting, Cuff Size: Large)    Pulse 75    Temp 98.2 F (36.8 C) (Oral)    Ht 5' 3" (1.6 m)    Wt 194 lb 9.6 oz (88.3 kg)    SpO2 97%    BMI 34.47 kg/m   Physical Exam  General appearance: alert, cooperative and  appears stated age Head: Normocephalic, without obvious abnormality, atraumatic Eyes: conjunctivae/corneas clear. PERRL, EOM's intact. Fundi benign. Ears: normal TM's and external ear canals both ears Nose: Nares normal. Septum midline. Mucosa normal. No drainage or sinus tenderness. Throat: lips, mucosa, and tongue normal; teeth and gums normal Neck: no adenopathy, no carotid bruit, no JVD, supple, symmetrical, trachea midline and thyroid not enlarged, symmetric, no tenderness/mass/nodules Lungs: clear to auscultation bilaterally Breasts: normal appearance, no masses or tenderness Heart: regular rate and rhythm, S1, S2 normal, no murmur, click, rub or gallop Abdomen: soft, non-tender; bowel sounds normal; no masses,  no organomegaly Extremities: extremities normal, atraumatic, no cyanosis or edema Pulses: 2+ and symmetric Skin: Skin color, texture, turgor normal. No rashes or lesions Neurologic: Alert and oriented X 3, normal strength and tone. Normal symmetric reflexes. Normal coordination and gait.      Assessment & Plan:   Problem List Items Addressed This Visit     Tubular adenoma of colon    She is due for 5 yr follow up on a TA that was removed by Byrnett in 2017.  Given age,  She may choose to decline, but will offer.      Long-term use of high-risk  medication   Obesity (BMI 30.0-34.9)    With prediabetes by 2022 labs.  Low GI diet and use of stationery bike for exercise recommended (given her husband's status)      Simple cyst of breast, left    S/p bilateral implants.  She is up to date on mammogams      Prediabetes    Her  random glucose is not  elevated but her A1c has been elevated and stable but suggests she is at risk for developing diabetes.  I recommend she follow a low glycemic index diet (which she is already doing) and participate regularly in an aerobic  exercise activity ,  Which she is hindered from doing because of her husband's condition.  Recommending use  of stationery bike. .    Lab Results  Component Value Date   HGBA1C 6.2 03/30/2021          Relevant Orders   Comp Met (CMET) (Completed)   HgB A1c (Completed)   Aortic atherosclerosis ([place])    Reviewed findings of prior Chest x ray.   Patient is advised to consider a trial of high potency statin therapy starting with 20 mg crestor every other day and advance as tolerated to twice weekly.        Other Visit Diagnoses     Encounter for screening mammogram for malignant neoplasm of breast    -  Primary   Relevant Orders   MM 3D SCREEN BREAST BILATERAL   Hyperlipidemia, unspecified hyperlipidemia type       Relevant Orders   Lipid Profile (Completed)   Other fatigue       Relevant Orders   CBC with Differential/Platelet (Completed)   TSH (Completed)       I am having Brianna Perry. Cislo maintain her multivitamin, Fish Oil, calcium carbonate, aspirin, Multiple Vitamins-Minerals (RA VISION-VITE PRESERVE PO), TURMERIC PO, Prevagen, zolpidem, omeprazole, and Probiotic Product (PROBIOTIC-10 PO).  No orders of the defined types were placed in this encounter.   There are no discontinued medications.  Follow-up: No follow-ups on file.   Crecencio Mc, MD

## 2021-03-30 NOTE — Assessment & Plan Note (Signed)
Her  random glucose is not  elevated but her A1c has been elevated and stable but suggests she is at risk for developing diabetes.  I recommend she follow a low glycemic index diet (which she is already doing) and participate regularly in an aerobic  exercise activity ,  Which she is hindered from doing because of her husband's condition.  Recommending use of stationery bike. .    Lab Results  Component Value Date   HGBA1C 6.2 03/30/2021

## 2021-03-30 NOTE — Assessment & Plan Note (Signed)
She is due for 5 yr follow up on a TA that was removed by Byrnett in 2017.  Given age,  She may choose to decline, but will offer.

## 2021-03-31 ENCOUNTER — Encounter: Payer: Self-pay | Admitting: Internal Medicine

## 2021-03-31 DIAGNOSIS — I700 Atherosclerosis of aorta: Secondary | ICD-10-CM | POA: Insufficient documentation

## 2021-03-31 NOTE — Assessment & Plan Note (Signed)
Reviewed findings of prior Chest x ray.   Patient is advised to consider a trial of high potency statin therapy starting with 20 mg crestor every other day and advance as tolerated to twice weekly.

## 2021-04-07 ENCOUNTER — Encounter: Payer: Self-pay | Admitting: Internal Medicine

## 2021-04-28 ENCOUNTER — Ambulatory Visit: Payer: PPO | Admitting: Internal Medicine

## 2021-04-29 ENCOUNTER — Other Ambulatory Visit: Payer: Self-pay

## 2021-04-29 ENCOUNTER — Encounter: Payer: Self-pay | Admitting: Internal Medicine

## 2021-04-29 ENCOUNTER — Ambulatory Visit (INDEPENDENT_AMBULATORY_CARE_PROVIDER_SITE_OTHER): Payer: PPO | Admitting: Internal Medicine

## 2021-04-29 VITALS — BP 142/76 | HR 82 | Temp 98.6°F | Ht 63.0 in | Wt 194.8 lb

## 2021-04-29 DIAGNOSIS — I700 Atherosclerosis of aorta: Secondary | ICD-10-CM | POA: Diagnosis not present

## 2021-04-29 DIAGNOSIS — E7800 Pure hypercholesterolemia, unspecified: Secondary | ICD-10-CM | POA: Diagnosis not present

## 2021-04-29 MED ORDER — ROSUVASTATIN CALCIUM 10 MG PO TABS: 10.0000 mg | ORAL_TABLET | ORAL | 1 refills | Status: DC

## 2021-04-29 NOTE — Patient Instructions (Signed)
IN  2017 the radiologist that read your chest x ray noted plaque in your aorta (the main blood vessel that comes off of the heart and takes blood to the rest of your body.  ) This is commonly referred to as "aortic atherosclerosis , " which when found in the aorta,   is considered to place you at increased risk for heart attack and stroke.  There are two high potency statins , Lipitor and Crestor,  that have been proved to prevent this plaque from rupturing ,  and therefore are helpful in preventing stroke  ? ?I'm glad that you have decided to start a trial of Crestor  To reduce your risk of stroke and heart attack . ? ?PLEASE TAKE IT EVERY OTHER DAY,  AND RETURN IN 3 WEEKS FOR A NON FASTING LIVER TEST  ? ? ?  ?

## 2021-04-29 NOTE — Progress Notes (Signed)
? ?Subjective:  ?Patient ID: Brianna Perry, female    DOB: 13-Jan-1933  Age: 88 y.o. MRN: [id number] ? ?CC: The primary encounter diagnosis was Pure hypercholesterolemia. A diagnosis of Thoracic aortic atherosclerosis ([place]) was also pertinent to this visit. ? ? ?This visit occurred during the SARS-CoV-2 public health emergency.  Safety protocols were in place, including screening questions prior to the visit, additional usage of staff PPE, and extensive cleaning of exam room while observing appropriate contact time as indicated for disinfecting solutions.   ? ?HPI ?[name] presents for discussion of results of chst x ray which noted aortic atherosclerosis  ?Chief Complaint  ?Patient presents with  ? Follow-up  ?  Follow up to discuss chest x ray results of aortic atherosclerosis   ? ? ?Aortic arch atherosclerosis:  Reviewed findings of chest x ray from 2017.Brianna Perry  Patient  Is not  taking a statin.  She has no personal history of stroke or  CAD and no prior history of statin intolerance.   Discussed the role of statin therapy in stablizing placque and preventing events.    ? ? ?Outpatient Medications Prior to Visit  ?Medication Sig Dispense Refill  ? aspirin 81 MG tablet Take 81 mg by mouth daily.    ? calcium carbonate (OS-CAL) 600 MG TABS tablet Take 1,200 mg by mouth daily.    ? Multiple Vitamin (MULTIVITAMIN) capsule Take 1 capsule by mouth daily.    ? Multiple Vitamins-Minerals (RA VISION-VITE PRESERVE PO) Take 1 tablet by mouth daily.    ? Omega-3 Fatty Acids (FISH OIL) 300 MG CAPS Take 1 capsule by mouth daily.    ? omeprazole (PRILOSEC) 20 MG capsule TAKE 1 CAPSULE(20 MG) BY MOUTH EVERY MORNING 90 capsule 1  ? Probiotic Product (PROBIOTIC-10 PO) Take 1 capsule by mouth daily.    ? TURMERIC PO Take 1 capsule by mouth daily.    ? zolpidem (AMBIEN) 5 MG tablet Take 1 tablet (5 mg total) by mouth at bedtime as needed for sleep. 15 tablet 2  ? Apoaequorin (PREVAGEN) 10 MG CAPS Take by mouth. (Patient not  taking: Reported on 04/29/2021)    ? ?No facility-administered medications prior to visit.  ? ? ?Review of Systems; ? ?Patient denies headache, fevers, malaise, unintentional weight loss, skin rash, eye pain, sinus congestion and sinus pain, sore throat, dysphagia,  hemoptysis , cough, dyspnea, wheezing, chest pain, palpitations, orthopnea, edema, abdominal pain, nausea, melena, diarrhea, constipation, flank pain, dysuria, hematuria, urinary  Frequency, nocturia, numbness, tingling, seizures,  Focal weakness, Loss of consciousness,  Tremor, insomnia, depression, anxiety, and suicidal ideation.   ? ? ? ?Objective:  ?BP (!) 142/76 (BP Location: Left Arm, Patient Position: Sitting, Cuff Size: Large)   Pulse 82   Temp 98.6 ?F (37 ?C) (Oral)   Ht 5\' 3"  (1.6 m)   Wt 194 lb 12.8 oz (88.4 kg)   SpO2 95%   BMI 34.51 kg/m?  ? ?BP Readings from Last 3 Encounters:  ?04/29/21 (!) 142/76  ?03/30/21 140/70  ?03/05/20 136/80  ? ? ?Wt Readings from Last 3 Encounters:  ?04/29/21 194 lb 12.8 oz (88.4 kg)  ?03/30/21 194 lb 9.6 oz (88.3 kg)  ?05/03/20 191 lb (86.6 kg)  ? ? ?General appearance: alert, cooperative and appears stated age ?Ears: normal TM's and external ear canals both ears ?Throat: lips, mucosa, and tongue normal; teeth and gums normal ?Neck: no adenopathy, no carotid bruit, supple, symmetrical, trachea midline and thyroid not enlarged, symmetric, no tenderness/mass/nodules ?Back:  symmetric, no curvature. ROM normal. No CVA tenderness. ?Lungs: clear to auscultation bilaterally ?Heart: regular rate and rhythm, S1, S2 normal, no murmur, click, rub or gallop ?Abdomen: soft, non-tender; bowel sounds normal; no masses,  no organomegaly ?Pulses: 2+ and symmetric ?Skin: Skin color, texture, turgor normal. No rashes or lesions ?Lymph nodes: Cervical, supraclavicular, and axillary nodes normal. ? ?Lab Results  ?Component Value Date  ? HGBA1C 6.2 03/30/2021  ? HGBA1C 6.2 03/03/2020  ? HGBA1C 6.2 02/10/2019  ? ? ?Lab Results   ?Component Value Date  ? CREATININE 0.69 03/30/2021  ? CREATININE 0.67 03/03/2020  ? CREATININE 0.69 02/10/2019  ? ? ?Lab Results  ?Component Value Date  ? WBC 6.4 03/30/2021  ? HGB 13.5 03/30/2021  ? HCT 41.1 03/30/2021  ? PLT 311.0 03/30/2021  ? GLUCOSE 104 (H) 03/30/2021  ? CHOL 232 (H) 03/30/2021  ? TRIG 148.0 03/30/2021  ? HDL 77.30 03/30/2021  ? LDLDIRECT 125.0 01/16/2018  ? LDLCALC 125 (H) 03/30/2021  ? ALT 16 03/30/2021  ? AST 17 03/30/2021  ? NA 139 03/30/2021  ? K 4.1 03/30/2021  ? CL 101 03/30/2021  ? CREATININE 0.69 03/30/2021  ? BUN 15 03/30/2021  ? CO2 31 03/30/2021  ? TSH 2.48 03/30/2021  ? HGBA1C 6.2 03/30/2021  ? ? ?MM 3D SCREEN BREAST W/IMPLANT BILATERAL ? ?Result Date: 04/16/2020 ?CLINICAL DATA:  Screening. History of treated right breast cancer, status post lumpectomy in 2005. EXAM: DIGITAL SCREENING BILATERAL MAMMOGRAM WITH IMPLANTS, CAD AND TOMOSYNTHESIS TECHNIQUE: Bilateral screening digital craniocaudal and mediolateral oblique mammograms were obtained. Bilateral screening digital breast tomosynthesis was performed. The images were evaluated with computer-aided detection. Standard and/or implant displaced views were performed. COMPARISON:  None. ACR Breast Density Category b: There are scattered areas of fibroglandular density. FINDINGS: The patient has retroglandular implants. There are no findings suspicious for malignancy. IMPRESSION: No mammographic evidence of malignancy. Stable postsurgical changes in the right breast. A result letter of this screening mammogram will be mailed directly to the patient. RECOMMENDATION: Screening mammogram in one year. (Code:SM-B-01Y) BI-RADS CATEGORY  1:  Negative. Electronically Signed   By: Fidela Salisbury M.D.   On: 04/16/2020 17:14  ? ? ?Assessment & Plan:  ? ?Problem List Items Addressed This Visit   ? ? Thoracic aortic atherosclerosis ([place])  ?  Aortic atherosclerosis :  Discussed need for statin therapy given documented evidence of moderate   atherosclerosis in the aorta noted on 2017 Chest x ray . She is wiilling ito initiate therapy with generic  Crestor taken 2 times weekly ? ?Lab Results  ?Component Value Date  ? CHOL 232 (H) 03/30/2021  ? HDL 77.30 03/30/2021  ? LDLCALC 125 (H) 03/30/2021  ? LDLDIRECT 125.0 01/16/2018  ? TRIG 148.0 03/30/2021  ? CHOLHDL 3 03/30/2021  ? ?  ?  ? Relevant Medications  ? rosuvastatin (CRESTOR) 10 MG tablet  ? ?Other Visit Diagnoses   ? ? Pure hypercholesterolemia    -  Primary  ? Relevant Medications  ? rosuvastatin (CRESTOR) 10 MG tablet  ? Other Relevant Orders  ? Comprehensive metabolic panel  ? ?  ? ? ?I spent 30 minutes dedicated to the care of this patient on the date of this encounter to include pre-visit review of patient's medical history,  most recent imaging studies, Face-to-face time with the patient , and post visit ordering of testing and therapeutics.   ? ?Follow-up: No follow-ups on file. ? ? ?Crecencio Mc, MD ?

## 2021-05-01 ENCOUNTER — Encounter: Payer: Self-pay | Admitting: Internal Medicine

## 2021-05-01 NOTE — Assessment & Plan Note (Signed)
Aortic atherosclerosis :  Discussed need for statin therapy given documented evidence of moderate  atherosclerosis in the aorta noted on 2017 Chest x ray . She is wiilling ito initiate therapy with generic  Crestor taken 2 times weekly ? ?Lab Results  ?Component Value Date  ? CHOL 232 (H) 03/30/2021  ? HDL 77.30 03/30/2021  ? LDLCALC 125 (H) 03/30/2021  ? LDLDIRECT 125.0 01/16/2018  ? TRIG 148.0 03/30/2021  ? CHOLHDL 3 03/30/2021  ? ?

## 2021-05-04 ENCOUNTER — Ambulatory Visit: Payer: PPO

## 2021-05-09 ENCOUNTER — Encounter: Payer: Self-pay | Admitting: Internal Medicine

## 2021-05-11 ENCOUNTER — Telehealth: Payer: Self-pay

## 2021-05-11 NOTE — Telephone Encounter (Signed)
Message sent to [facility] for guidance ?

## 2021-05-11 NOTE — Telephone Encounter (Signed)
Pt advised - will cb and advise [place] how she is doing  ?

## 2021-05-17 ENCOUNTER — Encounter: Payer: Self-pay | Admitting: Internal Medicine

## 2021-05-20 ENCOUNTER — Other Ambulatory Visit: Payer: PPO

## 2021-06-01 ENCOUNTER — Encounter: Payer: Self-pay | Admitting: Internal Medicine

## 2021-06-01 ENCOUNTER — Other Ambulatory Visit: Payer: Self-pay | Admitting: Internal Medicine

## 2021-06-01 DIAGNOSIS — T466X5A Adverse effect of antihyperlipidemic and antiarteriosclerotic drugs, initial encounter: Secondary | ICD-10-CM | POA: Insufficient documentation

## 2021-06-02 ENCOUNTER — Ambulatory Visit
Admission: RE | Admit: 2021-06-02 | Discharge: 2021-06-02 | Disposition: A | Discharge status: Home / Self Care | Payer: PPO | Source: Ambulatory Visit | Attending: Internal Medicine | Admitting: Internal Medicine

## 2021-06-02 DIAGNOSIS — Z1231 Encounter for screening mammogram for malignant neoplasm of breast: Secondary | ICD-10-CM | POA: Insufficient documentation

## 2021-06-08 ENCOUNTER — Telehealth: Payer: Self-pay | Admitting: Internal Medicine

## 2021-06-08 NOTE — Telephone Encounter (Signed)
Spoke with patient she requested a CB 06/2021 to schedule AWV  ?

## 2021-06-29 ENCOUNTER — Ambulatory Visit (INDEPENDENT_AMBULATORY_CARE_PROVIDER_SITE_OTHER): Payer: PPO

## 2021-06-29 VITALS — Ht 63.0 in | Wt 194.0 lb

## 2021-06-29 DIAGNOSIS — Z0000 Encounter for general adult medical examination without abnormal findings: Secondary | ICD-10-CM | POA: Diagnosis not present

## 2021-06-29 NOTE — Patient Instructions (Addendum)
?  Ms. Gessel , ?Thank you for taking time to come for your Medicare Wellness Visit. I appreciate your ongoing commitment to your health goals. Please review the following plan we discussed and let me know if I can assist you in the future.  ? ?These are the goals we discussed: ? Goals   ? ?  Maintain healthy lifestyle   ?  Stay active ?Healthy diet ?Stay hydrated ?  ? ?  ?  ?This is a list of the screening recommended for you and due dates:  ?Health Maintenance  ?Topic Date Due  ? Flu Shot  09/27/2021  ? Mammogram  06/03/2022  ? Tetanus Vaccine  07/20/2023  ? Pneumonia Vaccine  Completed  ? DEXA scan (bone density measurement)  Completed  ? COVID-19 Vaccine  Completed  ? Zoster (Shingles) Vaccine  Completed  ? HPV Vaccine  Aged Out  ?  ?

## 2021-06-29 NOTE — Progress Notes (Addendum)
Subjective:   Brianna Perry is a 88 y.o. female who presents for Medicare Annual (Subsequent) preventive examination.  Review of Systems    No ROS.  Medicare Wellness Virtual Visit.  Visual/audio telehealth visit, UTA vital signs.   See social history for additional risk factors.   Cardiac Risk Factors include: advanced age (>33men, >104 women)     Objective:    Today's Vitals   06/29/21 0842  Weight: 194 lb (88 kg)  Height: 5\' 3"  (1.6 m)   Body mass index is 34.37 kg/m.     06/29/2021   11:21 AM 05/03/2020   11:45 AM 01/14/2018   10:14 AM 01/11/2017   11:38 AM 01/04/2016    8:13 AM  Advanced Directives  Does Patient Have a Medical Advance Directive? Yes Yes Yes Yes No  Type of Estate agent of [place];Living will Healthcare Power of [place];Living will Healthcare Power of [place];Living will Living will;Healthcare Power of Attorney   Does patient want to make changes to medical advance directive? No - Patient declined No - Patient declined No - Patient declined No - Patient declined   Copy of Healthcare Power of Attorney in Chart? Yes - validated most recent copy scanned in chart (See row information) Yes - validated most recent copy scanned in chart (See row information) Yes - validated most recent copy scanned in chart (See row information) No - copy requested   Would patient like information on creating a medical advance directive?     Yes - Educational materials given    Current Medications (verified) Outpatient Encounter Medications as of 06/29/2021  Medication Sig   aspirin 81 MG tablet Take 81 mg by mouth daily.   calcium carbonate (OS-CAL) 600 MG TABS tablet Take 1,200 mg by mouth daily.   Multiple Vitamin (MULTIVITAMIN) capsule Take 1 capsule by mouth daily.   Multiple Vitamins-Minerals (RA VISION-VITE PRESERVE PO) Take 1 tablet by mouth daily.   Omega-3 Fatty Acids (FISH OIL) 300 MG CAPS Take 1 capsule by mouth daily.   omeprazole  (PRILOSEC) 20 MG capsule TAKE 1 CAPSULE(20 MG) BY MOUTH EVERY MORNING   Probiotic Product (PROBIOTIC-10 PO) Take 1 capsule by mouth daily.   TURMERIC PO Take 1 capsule by mouth daily.   zolpidem (AMBIEN) 5 MG tablet Take 1 tablet (5 mg total) by mouth at bedtime as needed for sleep.   No facility-administered encounter medications on file as of 06/29/2021.    Allergies (verified) Penicillins and Rosuvastatin   History: Past Medical History:  Diagnosis Date   Arthritis    Breast cancer (HCC) 2010   DCIS; Right Breast c lumpectomy with 10 weeks radiation   Breast mass x couple months   Right Breast   Cancer (HCC) 2010   Right breast   Chickenpox    GERD (gastroesophageal reflux disease)    Personal history of radiation therapy    Urine incontinence    Past Surgical History:  Procedure Laterality Date   ABDOMINAL HYSTERECTOMY  1968   AUGMENTATION MAMMAPLASTY Bilateral 1973   silicone/[place]   BREAST CYST ASPIRATION Left 2015   Negative   BREAST CYST ASPIRATION Left 02/2016   aspiration cyst LEFT breast   BREAST LUMPECTOMY Right 2010   BREAST SURGERY Right 2010   [facility]   COLONOSCOPY     [facility]   COLONOSCOPY WITH PROPOFOL N/A 03/23/2015   Procedure: COLONOSCOPY WITH PROPOFOL;  Surgeon: Earline Mayotte, MD;  Location: [facility] ENDOSCOPY;  Service: Endoscopy;  Laterality: N/A;   EYE SURGERY     TONSILLECTOMY  1941   TONSILLECTOMY     Family History  Problem Relation Age of Onset   Arthritis Mother    Heart disease Mother    Hypertension Mother    Heart disease Father    Breast cancer Paternal Aunt        60s   Breast cancer Paternal Grandmother        great grandmother   Social History   Socioeconomic History   Marital status: Married    Spouse name: Not on file   Number of children: Not on file   Years of education: Not on file   Highest education level: Not on file  Occupational History   Not on file  Tobacco Use   Smoking  status: Never   Smokeless tobacco: Never  Vaping Use   Vaping Use: Never used  Substance and Sexual Activity   Alcohol use: No   Drug use: No   Sexual activity: Never  Other Topics Concern   Not on file  Social History Narrative   Not on file   Social Determinants of Health   Financial Resource Strain: Low Risk    Difficulty of Paying Living Expenses: Not hard at all  Food Insecurity: No Food Insecurity   Worried About Programme researcher, broadcasting/film/video in the Last Year: Never true   Ran Out of Food in the Last Year: Never true  Transportation Needs: No Transportation Needs   Lack of Transportation (Medical): No   Lack of Transportation (Non-Medical): No  Physical Activity: Not on file  Stress: No Stress Concern Present   Feeling of Stress : Not at all  Social Connections: Unknown   Frequency of Communication with Friends and Family: Not on file   Frequency of Social Gatherings with Friends and Family: Not on file   Attends Religious Services: Not on file   Active Member of Clubs or Organizations: Not on file   Attends Banker Meetings: Not on file   Marital Status: Married    Tobacco Counseling Counseling given: Not Answered   Clinical Intake:  Pre-visit preparation completed: Yes        Diabetes: No  How often do you need to have someone help you when you read instructions, pamphlets, or other written materials from your doctor or pharmacy?: 1 - Never  Interpreter Needed?: No      Activities of Daily Living    06/29/2021    8:45 AM  In your present state of health, do you have any difficulty performing the following activities:  Hearing? 0  Vision? 0  Difficulty concentrating or making decisions? 0  Walking or climbing stairs? 0  Dressing or bathing? 0  Doing errands, shopping? 0  Preparing Food and eating ? N  Using the Toilet? N  In the past six months, have you accidently leaked urine? N  Do you have problems with loss of bowel control? N   Managing your Medications? N  Managing your Finances? N  Housekeeping or managing your Housekeeping? N    Patient Care Team: Sherlene Shams, MD as PCP - General (Internal Medicine) Sherlene Shams, MD (Internal Medicine) Lemar Livings Merrily Pew, MD (General Surgery)  Indicate any recent Medical Services you may have received from other than [facility] providers in the past year (date may be approximate).     Assessment:   This is a routine wellness examination for Skya.  Virtual  Visit via Telephone Note  I connected with  Roetta Sessions on 06/29/21 at  8:30 AM EDT by telephone and verified that I am speaking with the correct person using two identifiers.  Persons participating in the virtual visit: patient/Nurse Health Advisor   I discussed the limitations of performing an evaluation and management service by telehealth. The patient expressed understanding and agreed to proceed. We continued and completed visit with audio only. Some vital signs may be absent or patient reported.   Hearing/Vision screen Hearing Screening - Comments:: Patient is able to hear conversational tones without difficulty. No issues reported.  Vision Screening - Comments:: Cataract extraction, bilateral  Wears reading glasses only  Cataracts extracted, bilateral Followed by [facility]  Dietary issues and exercise activities discussed: Current Exercise Habits: Structured exercise class, Type of exercise: calisthenics, Intensity: Mild Healthy diet Good water intake   Goals Addressed               This Visit's Progress     Patient Stated     COMPLETED: Weight (lb) < 200 lb (90.7 kg) (pt-stated)   194 lb (88 kg)     Lose 20lbs Portion controlled, low carb diet Increase exercise 150 minutes per week       Other     Maintain healthy lifestyle        Stay active Healthy diet Stay hydrated       Depression Screen    06/29/2021    8:45 AM 04/29/2021    3:05 PM 03/30/2021    8:52 AM 05/03/2020    11:14 AM 03/05/2020   10:46 AM 02/12/2019    3:05 PM 01/14/2018   10:14 AM  PHQ 2/9 Scores  PHQ - 2 Score 0 1 0 0 0 0 0  PHQ- 9 Score    0 2 0     Fall Risk    06/29/2021    8:45 AM 04/29/2021    3:05 PM 03/30/2021    8:52 AM 05/03/2020   11:54 AM 03/05/2020   10:46 AM  Fall Risk   Falls in the past year? 0 0 0 0 0  Number falls in past yr: 0   0 0  Injury with Fall?    0 0  Risk for fall due to :  No Fall Risks No Fall Risks    Follow up Falls evaluation completed Falls evaluation completed Falls evaluation completed Falls evaluation completed Falls evaluation completed    FALL RISK PREVENTION PERTAINING TO THE HOME:  Home free of loose throw rugs in walkways, pet beds, electrical cords, etc? Yes  Adequate lighting in your home to reduce risk of falls? Yes   ASSISTIVE DEVICES UTILIZED TO PREVENT FALLS:  Life alert? No  Use of a cane, walker or w/c? No  Grab bars in the bathroom? Yes  Shower chair or bench in shower? Yes   TIMED UP AND GO: Was the test performed? No .   Cognitive Function: Patient is alert and oriented x3.     01/11/2017   11:36 AM 01/04/2016    8:24 AM  MMSE - Mini Mental State Exam  Orientation to time 5 5  Orientation to Place 5 5  Registration 3 3  Attention/ Calculation 5 5  Recall 3 3  Language- name 2 objects 2 2  Language- repeat 1 1  Language- follow 3 step command 3 3  Language- read & follow direction 1 1  Write a sentence 1 1  Copy  design 1 1  Total score 30 30        01/14/2018   10:26 AM  6CIT Screen  What Year? 0 points  What month? 0 points  What time? 0 points  Count back from 20 0 points  Months in reverse 0 points  Repeat phrase 0 points  Total Score 0 points    Immunizations Immunization History  Administered Date(s) Administered   Fluad Quad(high Dose 65+) 12/09/2020   Influenza,inj,Quad PF,6+ Mos 11/27/2012   Influenza-Unspecified 12/01/2013, 12/17/2014, 12/23/2015, 12/27/2017, 12/17/2018, 12/26/2019   Moderna  Covid-19 Vaccine Bivalent Booster 18yrs & up 01/05/2021   Moderna Sars-Covid-2 Vaccination 03/11/2019, 04/08/2019, 12/29/2019   Pneumococcal Conjugate-13 12/22/2013   Pneumococcal Polysaccharide-23 11/27/2009, 12/28/2014   Tdap 07/19/2013   Zoster Recombinat (Shingrix) 11/03/2020, 01/27/2021   Zoster, Live 11/28/2010   Screening Tests Health Maintenance  Topic Date Due   INFLUENZA VACCINE  09/27/2021   MAMMOGRAM  06/03/2022   TETANUS/TDAP  07/20/2023   Pneumonia Vaccine 26+ Years old  Completed   DEXA SCAN  Completed   COVID-19 Vaccine  Completed   Zoster Vaccines- Shingrix  Completed   HPV VACCINES  Aged Out   Health Maintenance There are no preventive care reminders to display for this patient.  Lung Cancer Screening: (Low Dose CT Chest recommended if Age 73-80 years, 30 pack-year currently smoking OR have quit w/in 15years.) does not qualify.   Hepatitis C Screening: does not qualify  Vision Screening: Recommended annual ophthalmology exams for early detection of glaucoma and other disorders of the eye.  Dental Screening: Recommended annual dental exams for proper oral hygiene  Community Resource Referral / Chronic Care Management: CRR required this visit?  No   CCM required this visit?  No      Plan:   Keep all routine maintenance appointments.   I have personally reviewed and noted the following in the patient's chart:   Medical and social history Use of alcohol, tobacco or illicit drugs  Current medications and supplements including opioid prescriptions.  Functional ability and status Nutritional status Physical activity Advanced directives List of other physicians Hospitalizations, surgeries, and ER visits in previous 12 months Vitals Screenings to include cognitive, depression, and falls Referrals and appointments  In addition, I have reviewed and discussed with patient certain preventive protocols, quality metrics, and best practice recommendations. A  written personalized care plan for preventive services as well as general preventive health recommendations were provided to patient.     OBrien-Blaney, Marvel Mcphillips L, LPN   06/01/4096    I have reviewed the above information and agree with above.   Duncan Dull, MD

## 2021-08-02 ENCOUNTER — Encounter: Payer: Self-pay | Admitting: Internal Medicine

## 2021-09-21 ENCOUNTER — Other Ambulatory Visit: Payer: Self-pay | Admitting: Internal Medicine

## 2021-10-11 ENCOUNTER — Encounter: Payer: Self-pay | Admitting: Internal Medicine

## 2021-10-11 ENCOUNTER — Ambulatory Visit (INDEPENDENT_AMBULATORY_CARE_PROVIDER_SITE_OTHER): Payer: PPO | Admitting: Internal Medicine

## 2021-10-11 VITALS — BP 126/74 | HR 84 | Temp 97.6°F | Ht 63.0 in | Wt 186.0 lb

## 2021-10-11 DIAGNOSIS — N3000 Acute cystitis without hematuria: Secondary | ICD-10-CM

## 2021-10-11 DIAGNOSIS — R300 Dysuria: Secondary | ICD-10-CM

## 2021-10-11 LAB — POCT URINALYSIS DIPSTICK
Blood, UA: NEGATIVE
Glucose, UA: NEGATIVE
Ketones, UA: POSITIVE
Nitrite, UA: POSITIVE
Protein, UA: POSITIVE — AB
Spec Grav, UA: 1.015 (ref 1.010–1.025)
Urobilinogen, UA: 4 E.U./dL — AB
pH, UA: 7 (ref 5.0–8.0)

## 2021-10-11 LAB — URINALYSIS, MICROSCOPIC ONLY

## 2021-10-11 MED ORDER — SULFAMETHOXAZOLE-TRIMETHOPRIM 800-160 MG PO TABS: 1.0000 | ORAL_TABLET | Freq: Two times a day (BID) | ORAL | 0 refills | Status: DC

## 2021-10-11 MED ORDER — ZOLPIDEM TARTRATE 5 MG PO TABS
5.0000 mg | ORAL_TABLET | Freq: Every evening | ORAL | 5 refills | Status: DC | PRN
Start: 2021-10-11 — End: 2022-05-30

## 2021-10-11 NOTE — Assessment & Plan Note (Signed)
UA POX is abnormal, positive for lueks and nitrites.  Septra DS prescribed,  already taking a probiotic

## 2021-10-11 NOTE — Progress Notes (Signed)
Subjective:  Patient ID: Brianna Perry, female    DOB: 10-17-1932  Age: 88 y.o. MRN: [id number]  CC: The primary encounter diagnosis was Dysuria. A diagnosis of Acute cystitis without hematuria was also pertinent to this visit.   HPI Brianna Perry presents for treatment of urinary frequency and dysuria that started ten days ago.  No history of recent travel ,  swimming,  intercourse ,  or recent UTI.  Has been usng OTC AZO. Denies nausea, diarrhea,  flank pain,  and fevers.  .  Chief Complaint  Patient presents with   Urinary Tract Infection    X 10 days. Burning with urination, frequent urination      Outpatient Medications Prior to Visit  Medication Sig Dispense Refill   aspirin 81 MG tablet Take 81 mg by mouth daily.     calcium carbonate (OS-CAL) 600 MG TABS tablet Take 1,200 mg by mouth daily.     Multiple Vitamin (MULTIVITAMIN) capsule Take 1 capsule by mouth daily.     Multiple Vitamins-Minerals (RA VISION-VITE PRESERVE PO) Take 1 tablet by mouth daily.     Omega-3 Fatty Acids (FISH OIL) 300 MG CAPS Take 1 capsule by mouth daily.     omeprazole (PRILOSEC) 20 MG capsule TAKE 1 CAPSULE(20 MG) BY MOUTH EVERY MORNING 90 capsule 1   Probiotic Product (PROBIOTIC-10 PO) Take 1 capsule by mouth daily.     TURMERIC PO Take 1 capsule by mouth daily.     zolpidem (AMBIEN) 5 MG tablet Take 1 tablet (5 mg total) by mouth at bedtime as needed for sleep. 15 tablet 2   No facility-administered medications prior to visit.    Review of Systems;  Patient denies headache, fevers, malaise, unintentional weight loss, skin rash, eye pain, sinus congestion and sinus pain, sore throat, dysphagia,  hemoptysis , cough, dyspnea, wheezing, chest pain, palpitations, orthopnea, edema, abdominal pain, nausea, melena, diarrhea, constipation, flank pain, dysuria, hematuria, urinary  Frequency, nocturia, numbness, tingling, seizures,  Focal weakness, Loss of consciousness,  Tremor, insomnia, depression,  anxiety, and suicidal ideation.      Objective:  BP 126/74 (BP Location: Left Arm, Patient Position: Sitting, Cuff Size: Large)   Pulse 84   Temp 97.6 F (36.4 C) (Oral)   Ht '5\' 3"'$  (1.6 m)   Wt 186 lb (84.4 kg)   SpO2 95%   BMI 32.95 kg/m   BP Readings from Last 3 Encounters:  10/11/21 126/74  04/29/21 (!) 142/76  03/30/21 140/70    Wt Readings from Last 3 Encounters:  10/11/21 186 lb (84.4 kg)  06/29/21 194 lb (88 kg)  04/29/21 194 lb 12.8 oz (88.4 kg)    General appearance: alert, cooperative and appears stated age Ears: normal TM's and external ear canals both ears Throat: lips, mucosa, and tongue normal; teeth and gums normal Neck: no adenopathy, no carotid bruit, supple, symmetrical, trachea midline and thyroid not enlarged, symmetric, no tenderness/mass/nodules Back: symmetric, no curvature. ROM normal. No CVA tenderness. Lungs: clear to auscultation bilaterally Heart: regular rate and rhythm, S1, S2 normal, no murmur, click, rub or gallop Abdomen: soft, non-tender; bowel sounds normal; no masses,  no organomegaly Pulses: 2+ and symmetric Skin: Skin color, texture, turgor normal. No rashes or lesions Lymph nodes: Cervical, supraclavicular, and axillary nodes normal.  Lab Results  Component Value Date   HGBA1C 6.2 03/30/2021   HGBA1C 6.2 03/03/2020   HGBA1C 6.2 02/10/2019    Lab Results  Component Value Date   CREATININE 0.69  03/30/2021   CREATININE 0.67 03/03/2020   CREATININE 0.69 02/10/2019    Lab Results  Component Value Date   WBC 6.4 03/30/2021   HGB 13.5 03/30/2021   HCT 41.1 03/30/2021   PLT 311.0 03/30/2021   GLUCOSE 104 (H) 03/30/2021   CHOL 232 (H) 03/30/2021   TRIG 148.0 03/30/2021   HDL 77.30 03/30/2021   LDLDIRECT 125.0 01/16/2018   LDLCALC 125 (H) 03/30/2021   ALT 16 03/30/2021   AST 17 03/30/2021   NA 139 03/30/2021   K 4.1 03/30/2021   CL 101 03/30/2021   CREATININE 0.69 03/30/2021   BUN 15 03/30/2021   CO2 31 03/30/2021    TSH 2.48 03/30/2021   HGBA1C 6.2 03/30/2021    MM 3D SCREEN BREAST W/IMPLANT BILATERAL  Result Date: 06/02/2021 CLINICAL DATA:  Screening. EXAM: DIGITAL SCREENING BILATERAL MAMMOGRAM WITH IMPLANTS, CAD AND TOMOSYNTHESIS TECHNIQUE: Bilateral screening digital craniocaudal and mediolateral oblique mammograms were obtained. Bilateral screening digital breast tomosynthesis was performed. The images were evaluated with computer-aided detection. Standard and/or implant displaced views were performed. COMPARISON:  Previous exam(s). ACR Breast Density Category b: There are scattered areas of fibroglandular density. FINDINGS: The patient has retroglandular implants. There are no findings suspicious for malignancy. IMPRESSION: No mammographic evidence of malignancy. A result letter of this screening mammogram will be mailed directly to the patient. RECOMMENDATION: Screening mammogram in one year. (Code:SM-B-01Y) BI-RADS CATEGORY  1:  Negative. Electronically Signed   By: Audie Pinto M.D.   On: 06/02/2021 15:11    Assessment & Plan:   Problem List Items Addressed This Visit     UTI (urinary tract infection)    UA POX is abnormal, positive for lueks and nitrites.  Septra DS prescribed,  already taking a probiotic       Relevant Medications   sulfamethoxazole-trimethoprim (BACTRIM DS) 800-160 MG tablet   Other Visit Diagnoses     Dysuria    -  Primary   Relevant Orders   POCT Urinalysis Dipstick (Completed)   Urine Culture   Urine Microscopic Only       Follow-up: No follow-ups on file.   Crecencio Mc, MD

## 2021-10-11 NOTE — Patient Instructions (Signed)
I am recommending that you start taking Septra DS twice daily.  If your symptoms have completely resolved after 3 days , you can stop the medication after Day 4; otherwise finish the 7 day course   The culture will take 48 hours to confirm if the correct antibiotic has been chosen . We will let you know either way    Continued Daily use of a probiotic is  advised

## 2021-10-14 LAB — URINE CULTURE
MICRO NUMBER:: 13781706
SPECIMEN QUALITY:: ADEQUATE

## 2021-12-09 ENCOUNTER — Ambulatory Visit (INDEPENDENT_AMBULATORY_CARE_PROVIDER_SITE_OTHER): Payer: PPO

## 2021-12-09 DIAGNOSIS — Z23 Encounter for immunization: Secondary | ICD-10-CM

## 2022-02-07 NOTE — Progress Notes (Signed)
This encounter was created in error - please disregard.

## 2022-02-20 ENCOUNTER — Encounter: Payer: PPO | Admitting: Physician Assistant

## 2022-02-20 NOTE — Progress Notes (Signed)
This encounter was created in error - please disregard.  Appt needed for husband.

## 2022-03-13 ENCOUNTER — Encounter: Payer: Self-pay | Admitting: Internal Medicine

## 2022-03-22 ENCOUNTER — Other Ambulatory Visit: Payer: Self-pay | Admitting: Internal Medicine

## 2022-03-31 ENCOUNTER — Ambulatory Visit (INDEPENDENT_AMBULATORY_CARE_PROVIDER_SITE_OTHER): Payer: PPO | Admitting: Internal Medicine

## 2022-03-31 ENCOUNTER — Encounter: Payer: Self-pay | Admitting: Internal Medicine

## 2022-03-31 VITALS — BP 122/68 | HR 73 | Temp 97.5°F | Ht 63.0 in | Wt 184.6 lb

## 2022-03-31 DIAGNOSIS — Z299 Encounter for prophylactic measures, unspecified: Secondary | ICD-10-CM

## 2022-03-31 DIAGNOSIS — E7800 Pure hypercholesterolemia, unspecified: Secondary | ICD-10-CM | POA: Diagnosis not present

## 2022-03-31 DIAGNOSIS — I700 Atherosclerosis of aorta: Secondary | ICD-10-CM

## 2022-03-31 DIAGNOSIS — Z79899 Other long term (current) drug therapy: Secondary | ICD-10-CM | POA: Diagnosis not present

## 2022-03-31 DIAGNOSIS — Z853 Personal history of malignant neoplasm of breast: Secondary | ICD-10-CM | POA: Diagnosis not present

## 2022-03-31 DIAGNOSIS — Z0001 Encounter for general adult medical examination with abnormal findings: Secondary | ICD-10-CM

## 2022-03-31 DIAGNOSIS — F320 Major depressive disorder, single episode, mild: Secondary | ICD-10-CM

## 2022-03-31 DIAGNOSIS — R7303 Prediabetes: Secondary | ICD-10-CM

## 2022-03-31 DIAGNOSIS — Z1231 Encounter for screening mammogram for malignant neoplasm of breast: Secondary | ICD-10-CM

## 2022-03-31 LAB — COMPREHENSIVE METABOLIC PANEL
ALT: 16 U/L (ref 0–35)
AST: 16 U/L (ref 0–37)
Albumin: 3.9 g/dL (ref 3.5–5.2)
Alkaline Phosphatase: 72 U/L (ref 39–117)
BUN: 13 mg/dL (ref 6–23)
CO2: 30 mEq/L (ref 19–32)
Calcium: 9.4 mg/dL (ref 8.4–10.5)
Chloride: 102 mEq/L (ref 96–112)
Creatinine, Ser: 0.57 mg/dL (ref 0.40–1.20)
GFR: 81.53 mL/min (ref >60.00)
Glucose, Bld: 101 mg/dL — ABNORMAL HIGH (ref 70–99)
Potassium: 4.2 mEq/L (ref 3.5–5.1)
Sodium: 141 mEq/L (ref 135–145)
Total Bilirubin: 0.4 mg/dL (ref 0.2–1.2)
Total Protein: 6.8 g/dL (ref 6.0–8.3)

## 2022-03-31 LAB — CBC WITH DIFFERENTIAL/PLATELET
Basophils Absolute: 0 10*3/uL (ref 0.0–0.1)
Basophils Relative: 0.9 % (ref 0.0–3.0)
Eosinophils Absolute: 0.1 10*3/uL (ref 0.0–0.7)
Eosinophils Relative: 1.7 % (ref 0.0–5.0)
HCT: 39.1 % (ref 36.0–46.0)
Hemoglobin: 13 g/dL (ref 12.0–15.0)
Lymphocytes Relative: 27.8 % (ref 12.0–46.0)
Lymphs Abs: 1.5 10*3/uL (ref 0.7–4.0)
MCHC: 33.3 g/dL (ref 30.0–36.0)
MCV: 88.8 fl (ref 78.0–100.0)
Monocytes Absolute: 0.5 10*3/uL (ref 0.1–1.0)
Monocytes Relative: 8.9 % (ref 3.0–12.0)
Neutro Abs: 3.3 10*3/uL (ref 1.4–7.7)
Neutrophils Relative %: 60.7 % (ref 43.0–77.0)
Platelets: 304 10*3/uL (ref 150.0–400.0)
RBC: 4.41 Mil/uL (ref 3.87–5.11)
RDW: 14.5 % (ref 11.5–15.5)
WBC: 5.5 10*3/uL (ref 4.0–10.5)

## 2022-03-31 LAB — LIPID PANEL
Cholesterol: 221 mg/dL — ABNORMAL HIGH (ref 0–200)
HDL: 79.1 mg/dL (ref >39.00)
LDL Cholesterol: 116 mg/dL — ABNORMAL HIGH (ref 0–99)
NonHDL: 141.45
Total CHOL/HDL Ratio: 3
Triglycerides: 128 mg/dL (ref 0.0–149.0)
VLDL: 25.6 mg/dL (ref 0.0–40.0)

## 2022-03-31 LAB — LDL CHOLESTEROL, DIRECT: Direct LDL: 121 mg/dL

## 2022-03-31 LAB — TSH: TSH: 3.59 u[IU]/mL (ref 0.35–5.50)

## 2022-03-31 LAB — HEMOGLOBIN A1C: Hgb A1c MFr Bld: 6.2 % (ref 4.6–6.5)

## 2022-03-31 MED ORDER — BUPROPION HCL ER (XL) 150 MG PO TB24: 150.0000 mg | ORAL_TABLET | Freq: Every day | ORAL | 2 refills | Status: DC

## 2022-03-31 MED ORDER — FLUTICASONE PROPIONATE 50 MCG/ACT NA SUSP: 2.0000 | Freq: Every day | NASAL | 6 refills | Status: AC

## 2022-03-31 NOTE — Patient Instructions (Signed)
I recommend a Trial of Flonase for the stuffy nose,  along with saline flushes Milta Deiters Meds: OTC)  The neck pain may be a strain from side sleeping with a soft pillow.  Try getting one with a soft neck roll.  Trial of wellbutrin for the depression.  One dose daily in the morning with breakfast..  if it makes your anxiety worse,  let me know    Use Debrox for the ear wax.  Alternate sides each night

## 2022-03-31 NOTE — Progress Notes (Addendum)
Patient ID: Brianna Perry, female    DOB: 09/26/1932  Age: 89 y.o. MRN: [id number]  The patient is here for annual preventive  examination and management of other chronic and acute problems.   The risk factors are reflected in the social history.  The roster of all physicians providing medical care to patient - is listed in the Snapshot section of the chart.  Activities of daily living:  The patient is 100% independent in all ADLs: dressing, toileting, feeding as well as independent mobility  Home safety : The patient has smoke detectors in the home. They wear seatbelts.  There are no firearms at home. There is no violence in the home.   There is no risks for hepatitis, STDs or HIV. There is no   history of blood transfusion. They have no travel history to infectious disease endemic areas of the world.  The patient has seen their dentist in the last six month. They have seen their eye doctor in the last year. They admit to slight hearing difficulty with regard to whispered voices and some television programs.  They have deferred audiologic testing in the last year.  They do not  have excessive sun exposure. Discussed the need for sun protection: hats, long sleeves and use of sunscreen if there is significant sun exposure.   Diet: the importance of a healthy diet is discussed. They do have a healthy diet.  The benefits of regular aerobic exercise were discussed. She walks 4 times per week ,  20 minutes.   Depression screen: there are no signs or vegative symptoms of depression- irritability, change in appetite, anhedonia, sadness/tearfullness.  Cognitive assessment: the patient manages all their financial and personal affairs and is actively engaged. They could relate day,date,year and events; recalled 2/3 objects at 3 minutes; performed clock-face test normally.  The following portions of the patient's history were reviewed and updated as appropriate: allergies, current medications, past  family history, past medical history,  past surgical history, past social history  and problem list.  Visual acuity was not assessed per patient preference since she has regular follow up with her ophthalmologist. Hearing and body mass index were assessed and reviewed.   During the course of the visit the patient was educated and counseled about appropriate screening and preventive services including : fall prevention , diabetes screening, nutrition counseling, colorectal cancer screening, and recommended immunizations.    CC: The primary encounter diagnosis was Long-term use of high-risk medication. Diagnoses of Pure hypercholesterolemia, Prediabetes, Personal history of breast cancer, Thoracic aortic atherosclerosis ([place]), Depression, major, single episode, mild ([place]), and Encounter for preventive measure were also pertinent to this visit.  1) right sided nasal congestion  2) depression symptoms .  She has spent her entire life taking care of others , first her daughyer and son,  now her husband , who has become increasingly frustrated and difficult to live with  3)  History Brianna Perry has a past medical history of Arthritis, Breast cancer ([place]) (2010), Breast mass (x couple months), Cancer ([place]) (2010), Chickenpox, GERD (gastroesophageal reflux disease), Personal history of radiation therapy, and Urine incontinence.   She has a past surgical history that includes Abdominal hysterectomy (1968); Tonsillectomy (1941); Colonoscopy; Eye surgery; Tonsillectomy; Colonoscopy with propofol (N/A, 03/23/2015); Breast surgery (Right, 2010); Breast lumpectomy (Right, 2010); Augmentation mammaplasty (Bilateral, 1973); Breast cyst aspiration (Left, 2015); and Breast cyst aspiration (Left, 02/2016).   Her family history includes Arthritis in her mother; Breast cancer in her paternal aunt and paternal  grandmother; Heart disease in her father and mother; Hypertension in her mother.She reports that she has never smoked.  She has never used smokeless tobacco. She reports that she does not drink alcohol and does not use drugs.  Outpatient Medications Prior to Visit  Medication Sig Dispense Refill   aspirin 81 MG tablet Take 81 mg by mouth daily.     calcium carbonate (OS-CAL) 600 MG TABS tablet Take 1,200 mg by mouth daily.     Multiple Vitamin (MULTIVITAMIN) capsule Take 1 capsule by mouth daily.     Multiple Vitamins-Minerals (RA VISION-VITE PRESERVE PO) Take 1 tablet by mouth daily.     Omega-3 Fatty Acids (FISH OIL) 300 MG CAPS Take 1 capsule by mouth daily.     omeprazole (PRILOSEC) 20 MG capsule TAKE 1 CAPSULE(20 MG) BY MOUTH EVERY MORNING 90 capsule 1   Probiotic Product (PROBIOTIC-10 PO) Take 1 capsule by mouth daily.     TURMERIC PO Take 1 capsule by mouth daily.     zolpidem (AMBIEN) 5 MG tablet Take 1 tablet (5 mg total) by mouth at bedtime as needed for sleep. 15 tablet 5   sulfamethoxazole-trimethoprim (BACTRIM DS) 800-160 MG tablet Take 1 tablet by mouth 2 (two) times daily. (Patient not taking: Reported on 03/31/2022) 14 tablet 0   No facility-administered medications prior to visit.    Review of Systems  Patient denies headache, fevers, malaise, unintentional weight loss, skin rash, eye pain, sinus congestion and sinus pain, sore throat, dysphagia,  hemoptysis , cough, dyspnea, wheezing, chest pain, palpitations, orthopnea, edema, abdominal pain, nausea, melena, diarrhea, constipation, flank pain, dysuria, hematuria, urinary  Frequency, nocturia, numbness, tingling, seizures,  Focal weakness, Loss of consciousness,  Tremor, insomnia, depression, anxiety, and suicidal ideation.     Objective:  BP 122/68   Pulse 73   Temp (!) 97.5 F (36.4 C) (Oral)   Ht 5' 3"$  (1.6 m)   Wt 184 lb 9.6 oz (83.7 kg)   SpO2 98%   BMI 32.70 kg/m   Physical Exam Vitals reviewed.  Constitutional:      General: She is not in acute distress.    Appearance: Normal appearance. She is normal weight. She is not  ill-appearing, toxic-appearing or diaphoretic.  HENT:     Head: Normocephalic.     Right Ear: There is impacted cerumen.     Left Ear: There is impacted cerumen.  Eyes:     General: No scleral icterus.       Right eye: No discharge.        Left eye: No discharge.     Conjunctiva/sclera: Conjunctivae normal.  Cardiovascular:     Rate and Rhythm: Normal rate and regular rhythm.     Heart sounds: Normal heart sounds.  Pulmonary:     Effort: Pulmonary effort is normal. No respiratory distress.     Breath sounds: Normal breath sounds.  Musculoskeletal:        General: Normal range of motion.  Skin:    General: Skin is warm and dry.  Neurological:     General: No focal deficit present.     Mental Status: She is alert and oriented to person, place, and time. Mental status is at baseline.  Psychiatric:        Mood and Affect: Mood normal.        Behavior: Behavior normal.        Thought Content: Thought content normal.        Judgment: Judgment normal.  Assessment & Plan:  Long-term use of high-risk medication -     TSH -     CBC with Differential/Platelet  Pure hypercholesterolemia -     Lipid panel -     LDL cholesterol, direct  Prediabetes -     Hemoglobin A1c -     Comprehensive metabolic panel  Personal history of breast cancer Assessment & Plan: She has decided to discontinue screening    Thoracic aortic atherosclerosis [facility]) Assessment & Plan: Aortic atherosclerosis :  Reviewed  need for statin therapy given documented evidence of moderate  atherosclerosis in the aorta noted on 2017 Chest x ray . She did not tolerate generic  Crestor taken 2 times weekly  Lab Results  Component Value Date   CHOL 221 (H) 03/31/2022   HDL 79.10 03/31/2022   LDLCALC 116 (H) 03/31/2022   LDLDIRECT 121.0 03/31/2022   TRIG 128.0 03/31/2022   CHOLHDL 3 03/31/2022     Depression, major, single episode, mild ([facility]) Assessment & Plan: Trial of wellbutrin offered   Encounter  for preventive measure Assessment & Plan: age appropriate education and counseling updated, referrals for preventative services and immunizations addressed, dietary and smoking counseling addressed, most recent labs reviewed.  I have personally reviewed and have noted:   1) the patient's medical and social history 2) The pt's use of alcohol, tobacco, and illicit drugs 3) The patient's current medications and supplements 4) Functional ability including ADL's, fall risk, home safety risk, hearing and visual impairment 5) Diet and physical activities 6) Evidence for depression or mood disorder 7) The patient's height, weight, and BMI have been recorded in the chart  I have made referrals, and provided counseling and education based on review of the above    Other orders -     buPROPion HCl ER (XL); Take 1 tablet (150 mg total) by mouth daily.  Dispense: 30 tablet; Refill: 2 -     Fluticasone Propionate; Place 2 sprays into both nostrils daily.  Dispense: 16 g; Refill: 6    Follow-up: Return in about 3 months (around 06/29/2022).   Crecencio Mc, MD

## 2022-04-01 DIAGNOSIS — F320 Major depressive disorder, single episode, mild: Secondary | ICD-10-CM | POA: Insufficient documentation

## 2022-04-01 NOTE — Assessment & Plan Note (Addendum)
Aortic atherosclerosis :  Reviewed  need for statin therapy given documented evidence of moderate  atherosclerosis in the aorta noted on 2017 Chest x ray . She did not tolerate generic  Crestor taken 2 times weekly  Lab Results  Component Value Date   CHOL 221 (H) 03/31/2022   HDL 79.10 03/31/2022   LDLCALC 116 (H) 03/31/2022   LDLDIRECT 121.0 03/31/2022   TRIG 128.0 03/31/2022   CHOLHDL 3 03/31/2022

## 2022-04-01 NOTE — Assessment & Plan Note (Signed)
She has decided to discontinue screening

## 2022-04-01 NOTE — Assessment & Plan Note (Signed)
Trial of wellbutrin offered

## 2022-04-07 NOTE — Assessment & Plan Note (Signed)

## 2022-04-07 NOTE — Addendum Note (Signed)
Addended by: Crecencio Mc on: 04/07/2022 12:33 PM   Modules accepted: Level of Service

## 2022-04-11 NOTE — Addendum Note (Signed)
Addended by: Crecencio Mc on: 04/11/2022 05:24 PM   Modules accepted: Level of Service

## 2022-04-21 ENCOUNTER — Encounter: Payer: Self-pay | Admitting: Internal Medicine

## 2022-04-21 NOTE — Telephone Encounter (Signed)
S/w pt wife - advised needs to go to ED for eval

## 2022-05-05 ENCOUNTER — Encounter: Payer: Self-pay | Admitting: Internal Medicine

## 2022-05-30 ENCOUNTER — Telehealth: Payer: Self-pay

## 2022-05-30 MED ORDER — ZOLPIDEM TARTRATE 5 MG PO TABS: 5.0000 mg | ORAL_TABLET | Freq: Every evening | ORAL | 5 refills | Status: DC | PRN

## 2022-05-30 NOTE — Addendum Note (Signed)
Addended by: Crecencio Mc on: 05/30/2022 05:07 PM   Modules accepted: Orders

## 2022-05-30 NOTE — Telephone Encounter (Signed)
Zolpidem   Refilled: 10/11/2021 Last OV: 03/31/2022 Next OV: 06/29/2022

## 2022-05-30 NOTE — Telephone Encounter (Signed)
Zolpidem has been refilled

## 2022-06-01 DIAGNOSIS — H2640 Unspecified secondary cataract: Secondary | ICD-10-CM | POA: Diagnosis not present

## 2022-06-01 DIAGNOSIS — H43813 Vitreous degeneration, bilateral: Secondary | ICD-10-CM | POA: Diagnosis not present

## 2022-06-05 ENCOUNTER — Encounter: Payer: Self-pay | Admitting: Internal Medicine

## 2022-06-27 ENCOUNTER — Other Ambulatory Visit: Payer: Self-pay

## 2022-06-27 MED ORDER — BUPROPION HCL ER (XL) 150 MG PO TB24: 150.0000 mg | ORAL_TABLET | Freq: Every day | ORAL | 1 refills | Status: DC

## 2022-06-29 ENCOUNTER — Encounter: Payer: Self-pay | Admitting: Internal Medicine

## 2022-06-29 ENCOUNTER — Ambulatory Visit (INDEPENDENT_AMBULATORY_CARE_PROVIDER_SITE_OTHER): Payer: PPO | Admitting: Internal Medicine

## 2022-06-29 VITALS — BP 138/70 | HR 80 | Temp 97.5°F | Ht 63.0 in | Wt 181.0 lb

## 2022-06-29 DIAGNOSIS — F5104 Psychophysiologic insomnia: Secondary | ICD-10-CM | POA: Diagnosis not present

## 2022-06-29 DIAGNOSIS — R5383 Other fatigue: Secondary | ICD-10-CM | POA: Diagnosis not present

## 2022-06-29 DIAGNOSIS — Z1231 Encounter for screening mammogram for malignant neoplasm of breast: Secondary | ICD-10-CM

## 2022-06-29 MED ORDER — OMEPRAZOLE 20 MG PO CPDR: DELAYED_RELEASE_CAPSULE | ORAL | 1 refills | Status: DC

## 2022-06-29 MED ORDER — SERTRALINE HCL 25 MG PO TABS: 25.0000 mg | ORAL_TABLET | Freq: Every day | ORAL | 3 refills | Status: DC

## 2022-06-29 NOTE — Progress Notes (Signed)
Subjective:  Patient ID: Brianna Perry, female    DOB: [birth year]  Age: 89 y.o. MRN: [id number]  CC: The primary encounter diagnosis was Encounter for screening mammogram for malignant neoplasm of breast. Diagnoses of Caregiver with fatigue and Chronic insomnia were also pertinent to this visit.   HPI Brianna Perry presents for  Chief Complaint  Patient presents with   Medical Management of Chronic Issues   Depression:   has been out of wellbutrin for several weeks due to pharmacy refusing to refill the prescription  upon her request .  She reporats No change in symptoms.  Cotinues to feel Feels overwhelmed. Worrying about every family members health issues .  Caring for husband who  has vascular dementia , expressive aphasia  and is often irritable and capable of throwing temper tantrums.  We discussed change in medication with  start of sertraline.   Outpatient Medications Prior to Visit  Medication Sig Dispense Refill   aspirin 81 MG tablet Take 81 mg by mouth daily.     calcium carbonate (OS-CAL) 600 MG TABS tablet Take 1,200 mg by mouth daily.     fluticasone (FLONASE) 50 MCG/ACT nasal spray Place 2 sprays into both nostrils daily. 16 g 6   Multiple Vitamin (MULTIVITAMIN) capsule Take 1 capsule by mouth daily.     Multiple Vitamins-Minerals (RA VISION-VITE PRESERVE PO) Take 1 tablet by mouth daily.     Omega-3 Fatty Acids (FISH OIL) 300 MG CAPS Take 1 capsule by mouth daily.     Probiotic Product (PROBIOTIC-10 PO) Take 1 capsule by mouth daily.     TURMERIC PO Take 1 capsule by mouth daily.     zolpidem (AMBIEN) 5 MG tablet Take 1 tablet (5 mg total) by mouth at bedtime as needed for sleep. 15 tablet 5   omeprazole (PRILOSEC) 20 MG capsule TAKE 1 CAPSULE(20 MG) BY MOUTH EVERY MORNING 90 capsule 1   buPROPion (WELLBUTRIN XL) 150 MG 24 hr tablet Take 1 tablet (150 mg total) by mouth daily. (Patient not taking: Reported on 06/29/2022) 90 tablet 1   No facility-administered  medications prior to visit.    Review of Systems;  Patient denies headache, fevers, malaise, unintentional weight loss, skin rash, eye pain, sinus congestion and sinus pain, sore throat, dysphagia,  hemoptysis , cough, dyspnea, wheezing, chest pain, palpitations, orthopnea, edema, abdominal pain, nausea, melena, diarrhea, constipation, flank pain, dysuria, hematuria, urinary  Frequency, nocturia, numbness, tingling, seizures,  Focal weakness, Loss of consciousness,  Tremor, insomnia, depression, anxiety, and suicidal ideation.    Using ambien most nights to sleep     Objective:  BP 138/70   Pulse 80   Temp (!) 97.5 F (36.4 C) (Oral)   Ht 5\' 3"  (1.6 m)   Wt 181 lb (82.1 kg)   SpO2 93%   BMI 32.06 kg/m   BP Readings from Last 3 Encounters:  06/29/22 138/70  03/31/22 122/68  10/11/21 126/74    Wt Readings from Last 3 Encounters:  06/29/22 181 lb (82.1 kg)  03/31/22 184 lb 9.6 oz (83.7 kg)  10/11/21 186 lb (84.4 kg)    Physical Exam Vitals reviewed.  Constitutional:      General: She is not in acute distress.    Appearance: Normal appearance. She is normal weight. She is not ill-appearing, toxic-appearing or diaphoretic.  HENT:     Head: Normocephalic.  Eyes:     General: No scleral icterus.       Right eye: No  discharge.        Left eye: No discharge.     Conjunctiva/sclera: Conjunctivae normal.  Cardiovascular:     Rate and Rhythm: Normal rate and regular rhythm.     Heart sounds: Normal heart sounds.  Pulmonary:     Effort: Pulmonary effort is normal. No respiratory distress.     Breath sounds: Normal breath sounds.  Musculoskeletal:        General: Normal range of motion.  Skin:    General: Skin is warm and dry.  Neurological:     General: No focal deficit present.     Mental Status: She is alert and oriented to person, place, and time. Mental status is at baseline.  Psychiatric:        Mood and Affect: Mood normal.        Behavior: Behavior normal.         Thought Content: Thought content normal.        Judgment: Judgment normal.     Lab Results  Component Value Date   HGBA1C 6.2 03/31/2022   HGBA1C 6.2 03/30/2021   HGBA1C 6.2 03/03/2020    Lab Results  Component Value Date   CREATININE 0.57 03/31/2022   CREATININE 0.69 03/30/2021   CREATININE 0.67 03/03/2020    Lab Results  Component Value Date   WBC 5.5 03/31/2022   HGB 13.0 03/31/2022   HCT 39.1 03/31/2022   PLT 304.0 03/31/2022   GLUCOSE 101 (H) 03/31/2022   CHOL 221 (H) 03/31/2022   TRIG 128.0 03/31/2022   HDL 79.10 03/31/2022   LDLDIRECT 121.0 03/31/2022   LDLCALC 116 (H) 03/31/2022   ALT 16 03/31/2022   AST 16 03/31/2022   NA 141 03/31/2022   K 4.2 03/31/2022   CL 102 03/31/2022   CREATININE 0.57 03/31/2022   BUN 13 03/31/2022   CO2 30 03/31/2022   TSH 3.59 03/31/2022   HGBA1C 6.2 03/31/2022    MM 3D SCREEN BREAST W/IMPLANT BILATERAL  Result Date: 06/02/2021 CLINICAL DATA:  Screening. EXAM: DIGITAL SCREENING BILATERAL MAMMOGRAM WITH IMPLANTS, CAD AND TOMOSYNTHESIS TECHNIQUE: Bilateral screening digital craniocaudal and mediolateral oblique mammograms were obtained. Bilateral screening digital breast tomosynthesis was performed. The images were evaluated with computer-aided detection. Standard and/or implant displaced views were performed. COMPARISON:  Previous exam(s). ACR Breast Density Category b: There are scattered areas of fibroglandular density. FINDINGS: The patient has retroglandular implants. There are no findings suspicious for malignancy. IMPRESSION: No mammographic evidence of malignancy. A result letter of this screening mammogram will be mailed directly to the patient. RECOMMENDATION: Screening mammogram in one year. (Code:SM-B-01Y) BI-RADS CATEGORY  1:  Negative. Electronically Signed   By: Emmaline Kluver M.D.   On: 06/02/2021 15:11    Assessment & Plan:  .Encounter for screening mammogram for malignant neoplasm of breast  Caregiver with  fatigue -     AMB Referral to Community Care Coordinaton ([facility] Patients)  Chronic insomnia Assessment & Plan: She had successfully weaned from  all hypnotics but since her husband's last CVA she has started to require prn use of ambien due to anxiety . I have reviewed the dangers of prescribing this drug to the elderly with patient.   She is aware of the risks    Other orders -     Omeprazole; TAKE 1 CAPSULE(20 MG) BY MOUTH EVERY MORNING  Dispense: 90 capsule; Refill: 1 -     Sertraline HCl; Take 1 tablet (25 mg total) by mouth daily.  Dispense: 90 tablet; Refill:  3    Follow-up: No follow-ups on file.   Sherlene Shams, MD

## 2022-06-29 NOTE — Patient Instructions (Signed)
Please start the  Sertraline (generic for Zoloft) at  1 tablet daily in the morning with breakfast for the first 2 weeks to avoid nausea.  You can increase to 2  tablet after 2 week if you have not felt an improvement in your anxiety and are not having side effects of nausea.   I have made a referral for social worker to contact you with community resource information

## 2022-06-30 ENCOUNTER — Encounter: Payer: Self-pay | Admitting: Internal Medicine

## 2022-06-30 ENCOUNTER — Telehealth: Payer: Self-pay | Admitting: *Deleted

## 2022-06-30 NOTE — Progress Notes (Signed)
  Care Coordination   Note   06/30/2022 Name: [name] MRN: [id number] DOB: [birth year]  [name] is a 89 y.o. year old female who sees Darrick Huntsman, Mar Daring, MD for primary care. I reached out to Roetta Sessions by phone today to offer care coordination services.  Ms. Meservey was given information about Care Coordination services today including:   The Care Coordination services include support from the care team which includes your Nurse Coordinator, Clinical Social Worker, or Pharmacist.  The Care Coordination team is here to help remove barriers to the health concerns and goals most important to you. Care Coordination services are voluntary, and the patient may decline or stop services at any time by request to their care team member.   Care Coordination Consent Status: Patient agreed to services and verbal consent obtained.   Follow up plan:  Telephone appointment with care coordination team member scheduled for:  07/03/2022  Encounter Outcome:  Pt. Scheduled from referral   Burman Nieves, [facility] Care Coordination Care Guide Direct Dial: [phone]

## 2022-07-01 NOTE — Assessment & Plan Note (Signed)
She had successfully weaned from  all hypnotics but since her husband's last CVA she has started to require prn use of ambien due to anxiety . I have reviewed the dangers of prescribing this drug to the elderly with patient.   She is aware of the risks

## 2022-07-03 ENCOUNTER — Ambulatory Visit: Payer: Self-pay | Admitting: *Deleted

## 2022-07-04 NOTE — Patient Outreach (Signed)
  Care Coordination   Initial Visit Note   07/04/2022 Late Entry Name: ANALYSE FRUEHAUF MRN: [id number] DOB: 10/05/1932  GLADYCE WELLENS is a 89 y.o. year old female who sees Darrick Huntsman, Mar Daring, MD for primary care. I spoke with  Roetta Sessions by phone today.  What matters to the patients health and wellness today?  Caregiver support, patient interested in respite care referral.    Goals Addressed             This Visit's Progress    Patient Stated       Care Coordination Interventions: CSW to refer patient for respite care program the [facility] Referral completed, waiting on funds to replenish(possibly by the first of June)          SDOH assessments and interventions completed:  Yes  SDOH Interventions Today    Flowsheet Row Most Recent Value  SDOH Interventions   Housing Interventions Intervention Not Indicated  Transportation Interventions Intervention Not Indicated        Care Coordination Interventions:  Yes, provided  Interventions Today    Flowsheet Row Most Recent Value  Chronic Disease   Chronic disease during today's visit Other  [caregiver stress]  General Interventions   General Interventions Discussed/Reviewed General Interventions Discussed, Community Resources, Level of Care  Level of Care Adult Daycare, Personal Care Services  [spouse interested in respite care, declines Day Program resources or private duty -private duty care attempted in the past, patient does not want to consider this again]       Follow up plan: Follow up call scheduled for 07/18/22    Encounter Outcome:  Pt. Visit Completed

## 2022-07-04 NOTE — Patient Instructions (Signed)
Visit Information  Thank you for taking time to visit with me today. Please don't hesitate to contact me if I can be of assistance to you.   Following are the goals we discussed today:   Goals Addressed             This Visit's Progress    Patient Stated       Care Coordination Interventions: CSW to refer patient for respite care program the [facility] Referral completed, waiting on funds to replenish(possibly by the first of June)          Our next appointment is by telephone on 07/18/22 at 1pm  Please call the care guide team at [phone] if you need to cancel or reschedule your appointment.   If you are experiencing a Mental Health or Behavioral Health Crisis or need someone to talk to, please call [phone]   Patient verbalizes understanding of instructions and care plan provided today and agrees to view in MyChart. Active MyChart status and patient understanding of how to access instructions and care plan via MyChart confirmed with patient.     Telephone follow up appointment with care management team member scheduled for: 07/18/22   Verna Czech, LCSW Clinical Social Worker  [facility] Care Management [phone]

## 2022-07-11 ENCOUNTER — Ambulatory Visit (INDEPENDENT_AMBULATORY_CARE_PROVIDER_SITE_OTHER): Payer: PPO

## 2022-07-11 VITALS — Wt 181.0 lb

## 2022-07-11 DIAGNOSIS — Z0000 Encounter for general adult medical examination without abnormal findings: Secondary | ICD-10-CM | POA: Diagnosis not present

## 2022-07-11 NOTE — Patient Instructions (Signed)
Brianna Perry , Thank you for taking time to come for your Medicare Wellness Visit. I appreciate your ongoing commitment to your health goals. Please review the following plan we discussed and let me know if I can assist you in the future.   These are the goals we discussed:  Goals      Maintain healthy lifestyle     Stay active Healthy diet Stay hydrated     Patient Stated     Care Coordination Interventions: CSW to refer patient for respite care program the [facility] Referral completed, waiting on funds to replenish(possibly by the first of June)          This is a list of the screening recommended for you and due dates:  Health Maintenance  Topic Date Due   COVID-19 Vaccine (5 - 2023-24 season) 10/28/2021   Flu Shot  09/28/2022   Medicare Annual Wellness Visit  07/11/2023   DTaP/Tdap/Td vaccine (2 - Td or Tdap) 07/20/2023   Pneumonia Vaccine  Completed   DEXA scan (bone density measurement)  Completed   Zoster (Shingles) Vaccine  Completed   HPV Vaccine  Aged Out   Mammogram  Discontinued    Advanced directives:Advance directive discussed with you today. I have provided a copy for you to complete at home and have notarized. Once this is complete please bring a copy in to our office so we can scan it into your chart.   Conditions/risks identified: Aim for 30 minutes of exercise or brisk walking, 6-8 glasses of water, and 5 servings of fruits and vegetables each day.   Next appointment: Follow up in one year for your annual wellness visit 07/12/23   Preventive Care 65 Years and Older, Female Preventive care refers to lifestyle choices and visits with your health care provider that can promote health and wellness. What does preventive care include? A yearly physical exam. This is also called an annual well check. Dental exams once or twice a year. Routine eye exams. Ask your health care provider how often you should have your eyes checked. Personal lifestyle  choices, including: Daily care of your teeth and gums. Regular physical activity. Eating a healthy diet. Avoiding tobacco and drug use. Limiting alcohol use. Practicing safe sex. Taking low-dose aspirin every day. Taking vitamin and mineral supplements as recommended by your health care provider. What happens during an annual well check? The services and screenings done by your health care provider during your annual well check will depend on your age, overall health, lifestyle risk factors, and family history of disease. Counseling  Your health care provider may ask you questions about your: Alcohol use. Tobacco use. Drug use. Emotional well-being. Home and relationship well-being. Sexual activity. Eating habits. History of falls. Memory and ability to understand (cognition). Work and work Astronomer. Reproductive health. Screening  You may have the following tests or measurements: Height, weight, and BMI. Blood pressure. Lipid and cholesterol levels. These may be checked every 5 years, or more frequently if you are over 49 years old. Skin check. Lung cancer screening. You may have this screening every year starting at age 2 if you have a 30-pack-year history of smoking and currently smoke or have quit within the past 15 years. Fecal occult blood test (FOBT) of the stool. You may have this test every year starting at age 15. Flexible sigmoidoscopy or colonoscopy. You may have a sigmoidoscopy every 5 years or a colonoscopy every 10 years starting at age 77. Hepatitis C blood  test. Hepatitis B blood test. Sexually transmitted disease (STD) testing. Diabetes screening. This is done by checking your blood sugar (glucose) after you have not eaten for a while (fasting). You may have this done every 1-3 years. Bone density scan. This is done to screen for osteoporosis. You may have this done starting at age 104. Mammogram. This may be done every 1-2 years. Talk to your health care  provider about how often you should have regular mammograms. Talk with your health care provider about your test results, treatment options, and if necessary, the need for more tests. Vaccines  Your health care provider may recommend certain vaccines, such as: Influenza vaccine. This is recommended every year. Tetanus, diphtheria, and acellular pertussis (Tdap, Td) vaccine. You may need a Td booster every 10 years. Zoster vaccine. You may need this after age 85. Pneumococcal 13-valent conjugate (PCV13) vaccine. One dose is recommended after age 26. Pneumococcal polysaccharide (PPSV23) vaccine. One dose is recommended after age 4. Talk to your health care provider about which screenings and vaccines you need and how often you need them. This information is not intended to replace advice given to you by your health care provider. Make sure you discuss any questions you have with your health care provider. Document Released: 03/12/2015 Document Revised: 11/03/2015 Document Reviewed: 12/15/2014 Elsevier Interactive Patient Education  2017 [employer].  Fall Prevention in the Home Falls can cause injuries. They can happen to people of all ages. There are many things you can do to make your home safe and to help prevent falls. What can I do on the outside of my home? Regularly fix the edges of walkways and driveways and fix any cracks. Remove anything that might make you trip as you walk through a door, such as a raised step or threshold. Trim any bushes or trees on the path to your home. Use bright outdoor lighting. Clear any walking paths of anything that might make someone trip, such as rocks or tools. Regularly check to see if handrails are loose or broken. Make sure that both sides of any steps have handrails. Any raised decks and porches should have guardrails on the edges. Have any leaves, snow, or ice cleared regularly. Use sand or salt on walking paths during winter. Clean up any  spills in your garage right away. This includes oil or grease spills. What can I do in the bathroom? Use night lights. Install grab bars by the toilet and in the tub and shower. Do not use towel bars as grab bars. Use non-skid mats or decals in the tub or shower. If you need to sit down in the shower, use a plastic, non-slip stool. Keep the floor dry. Clean up any water that spills on the floor as soon as it happens. Remove soap buildup in the tub or shower regularly. Attach bath mats securely with double-sided non-slip rug tape. Do not have throw rugs and other things on the floor that can make you trip. What can I do in the bedroom? Use night lights. Make sure that you have a light by your bed that is easy to reach. Do not use any sheets or blankets that are too big for your bed. They should not hang down onto the floor. Have a firm chair that has side arms. You can use this for support while you get dressed. Do not have throw rugs and other things on the floor that can make you trip. What can I do in the kitchen? Clean  up any spills right away. Avoid walking on wet floors. Keep items that you use a lot in easy-to-reach places. If you need to reach something above you, use a strong step stool that has a grab bar. Keep electrical cords out of the way. Do not use floor polish or wax that makes floors slippery. If you must use wax, use non-skid floor wax. Do not have throw rugs and other things on the floor that can make you trip. What can I do with my stairs? Do not leave any items on the stairs. Make sure that there are handrails on both sides of the stairs and use them. Fix handrails that are broken or loose. Make sure that handrails are as long as the stairways. Check any carpeting to make sure that it is firmly attached to the stairs. Fix any carpet that is loose or worn. Avoid having throw rugs at the top or bottom of the stairs. If you do have throw rugs, attach them to the floor  with carpet tape. Make sure that you have a light switch at the top of the stairs and the bottom of the stairs. If you do not have them, ask someone to add them for you. What else can I do to help prevent falls? Wear shoes that: Do not have high heels. Have rubber bottoms. Are comfortable and fit you well. Are closed at the toe. Do not wear sandals. If you use a stepladder: Make sure that it is fully opened. Do not climb a closed stepladder. Make sure that both sides of the stepladder are locked into place. Ask someone to hold it for you, if possible. Clearly mark and make sure that you can see: Any grab bars or handrails. First and last steps. Where the edge of each step is. Use tools that help you move around (mobility aids) if they are needed. These include: Canes. Walkers. Scooters. Crutches. Turn on the lights when you go into a dark area. Replace any light bulbs as soon as they burn out. Set up your furniture so you have a clear path. Avoid moving your furniture around. If any of your floors are uneven, fix them. If there are any pets around you, be aware of where they are. Review your medicines with your doctor. Some medicines can make you feel dizzy. This can increase your chance of falling. Ask your doctor what other things that you can do to help prevent falls. This information is not intended to replace advice given to you by your health care provider. Make sure you discuss any questions you have with your health care provider. Document Released: 12/10/2008 Document Revised: 07/22/2015 Document Reviewed: 03/20/2014 Elsevier Interactive Patient Education  2017 [employer].

## 2022-07-11 NOTE — Progress Notes (Addendum)
Subjective:   Brianna Perry is a 89 y.o. female who presents for Medicare Annual (Subsequent) preventive examination.   PHQ9 same as 06/29/22 (addendum 07/27/22) Review of Systems    I connected with  LAKIYAH Perry on 07/11/22 by a audio enabled telemedicine application and verified that I am speaking with the correct person using two identifiers.  Patient Location: Home  Provider Location: Home Office  I discussed the limitations of evaluation and management by telemedicine. The patient expressed understanding and agreed to proceed.  Cardiac Risk Factors include: advanced age (>65men, >63 women)     Objective:    Today's Vitals   07/11/22 0815  Weight: 181 lb (82.1 kg)   Body mass index is 32.06 kg/m.     07/11/2022    8:21 AM 06/29/2021   11:21 AM 05/03/2020   11:45 AM 01/14/2018   10:14 AM 01/11/2017   11:38 AM 01/04/2016    8:13 AM  Advanced Directives  Does Patient Have a Medical Advance Directive? No Yes Yes Yes Yes No  Type of Special educational needs teacher of [place];Living will Healthcare Power of [place];Living will Healthcare Power of [place];Living will Living will;Healthcare Power of Attorney   Does patient want to make changes to medical advance directive?  No - Patient declined No - Patient declined No - Patient declined No - Patient declined   Copy of Healthcare Power of Attorney in Chart?  Yes - validated most recent copy scanned in chart (See row information) Yes - validated most recent copy scanned in chart (See row information) Yes - validated most recent copy scanned in chart (See row information) No - copy requested   Would patient like information on creating a medical advance directive? Yes (MAU/Ambulatory/Procedural Areas - Information given)     Yes - Educational materials given    Current Medications (verified) Outpatient Encounter Medications as of 07/11/2022  Medication Sig   aspirin 81 MG tablet Take 81 mg by mouth daily.   calcium  carbonate (OS-CAL) 600 MG TABS tablet Take 1,200 mg by mouth daily.   fluticasone (FLONASE) 50 MCG/ACT nasal spray Place 2 sprays into both nostrils daily.   Multiple Vitamin (MULTIVITAMIN) capsule Take 1 capsule by mouth daily.   Multiple Vitamins-Minerals (RA VISION-VITE PRESERVE PO) Take 1 tablet by mouth daily.   Omega-3 Fatty Acids (FISH OIL) 300 MG CAPS Take 1 capsule by mouth daily.   omeprazole (PRILOSEC) 20 MG capsule TAKE 1 CAPSULE(20 MG) BY MOUTH EVERY MORNING   Probiotic Product (PROBIOTIC-10 PO) Take 1 capsule by mouth daily.   sertraline (ZOLOFT) 25 MG tablet Take 1 tablet (25 mg total) by mouth daily.   TURMERIC PO Take 1 capsule by mouth daily.   zolpidem (AMBIEN) 5 MG tablet Take 1 tablet (5 mg total) by mouth at bedtime as needed for sleep.   No facility-administered encounter medications on file as of 07/11/2022.    Allergies (verified) Penicillins and Rosuvastatin   History: Past Medical History:  Diagnosis Date   Arthritis    Breast cancer (HCC) 2010   DCIS; Right Breast c lumpectomy with 10 weeks radiation   Breast mass x couple months   Right Breast   Cancer (HCC) 2010   Right breast   Chickenpox    GERD (gastroesophageal reflux disease)    Personal history of radiation therapy    Urine incontinence    Past Surgical History:  Procedure Laterality Date   ABDOMINAL HYSTERECTOMY  1968   AUGMENTATION MAMMAPLASTY  Bilateral 1973   silicone/[place]   BREAST CYST ASPIRATION Left 2015   Negative   BREAST CYST ASPIRATION Left 02/2016   aspiration cyst LEFT breast   BREAST LUMPECTOMY Right 2010   BREAST SURGERY Right 2010   Dr Sherlyn Le[facility] in [facility]   COLONOSCOPY     [facility]   COLONOSCOPY WITH PROPOFOL N/A 03/23/2015   Procedure: COLONOSCOPY WITH PROPOFOL;  Surgeon: Earline Mayotte, MD;  [facility] ENDOSCOPY;  Service: Endoscopy;  Laterality: N/A;   EYE SURGERY     TONSILLECTOMY  1941   TONSILLECTOMY     Family History  Problem Relation  Age of Onset   Arthritis Mother    Heart disease Mother    Hypertension Mother    Heart disease Father    Breast cancer Paternal Aunt        46s   Breast cancer Paternal Grandmother        great grandmother   Social History   Socioeconomic History   Marital status: Married    Spouse name: Not on file   Number of children: Not on file   Years of education: Not on file   Highest education level: Not on file  Occupational History   Not on file  Tobacco Use   Smoking status: Never   Smokeless tobacco: Never  Vaping Use   Vaping Use: Never used  Substance and Sexual Activity   Alcohol use: No   Drug use: No   Sexual activity: Never  Other Topics Concern   Not on file  Social History Narrative   Not on file   Social Determinants of Health   Financial Resource Strain: Low Risk  (07/11/2022)   Overall Financial Resource Strain (CARDIA)    Difficulty of Paying Living Expenses: Not hard at all  Food Insecurity: No Food Insecurity (07/11/2022)   Hunger Vital Sign    Worried About Running Out of Food in the Last Year: Never true    Ran Out of Food in the Last Year: Never true  Transportation Needs: No Transportation Needs (07/03/2022)   PRAPARE - Administrator, Civil Service (Medical): No    Lack of Transportation (Non-Medical): No  Physical Activity: Insufficiently Active (07/11/2022)   Exercise Vital Sign    Days of Exercise per Week: 7 days    Minutes of Exercise per Session: 20 min  Stress: No Stress Concern Present (07/11/2022)   [employer] of Occupational Health - Occupational Stress Questionnaire    Feeling of Stress : Not at all  Social Connections: Socially Integrated (07/11/2022)   Social Connection and Isolation Panel [NHANES]    Frequency of Communication with Friends and Family: More than three times a week    Frequency of Social Gatherings with Friends and Family: More than three times a week    Attends Religious Services: More than 4 times per  year    Active Member of [employer] or Organizations: Yes    Attends Engineer, structural: More than 4 times per year    Marital Status: Married    Tobacco Counseling Counseling given: Yes   Clinical Intake:  Pre-visit preparation completed: Yes  Pain : No/denies pain     Nutritional Risks: None Diabetes: No  How often do you need to have someone help you when you read instructions, pamphlets, or other written materials from your doctor or pharmacy?: 1 - Never  Diabetic?NO  Interpreter Needed?: No  Information entered by :: Fredirick Maudlin  Activities of Daily Living    07/11/2022    8:22 AM  In your present state of health, do you have any difficulty performing the following activities:  Hearing? 0  Vision? 0  Difficulty concentrating or making decisions? 0  Walking or climbing stairs? 0  Dressing or bathing? 0  Doing errands, shopping? 0  Preparing Food and eating ? N  Using the Toilet? N  In the past six months, have you accidently leaked urine? N  Do you have problems with loss of bowel control? N  Managing your Medications? N  Managing your Finances? N  Housekeeping or managing your Housekeeping? N    Patient Care Team: Sherlene Shams, MD as PCP - General (Internal Medicine) Sherlene Shams, MD (Internal Medicine)  Indicate any recent Medical Services you may have received from other than [facility] providers in the past year (date may be approximate).     Assessment:   This is a routine wellness examination for Zephyra.  Hearing/Vision screen Hearing Screening - Comments:: Denies hearing difficulties   Vision Screening - Comments:: Wears rx glasses - up to date with routine [facility] exams with  [facility]  Dietary issues and exercise activities discussed: Current Exercise Habits: Home exercise routine, Type of exercise: Other - see comments (yardwork), Time (Minutes): 60, Frequency (Times/Week): 3, Weekly Exercise (Minutes/Week): 180,  Exercise limited by: None identified   Goals Addressed             This Visit's Progress    Maintain healthy lifestyle   On track    Stay active Healthy diet Stay hydrated      Depression Screen    06/29/2022    8:54 AM 03/31/2022    8:40 AM 06/29/2021    8:45 AM 04/29/2021    3:05 PM 03/30/2021    8:52 AM 05/03/2020   11:14 AM 03/05/2020   10:46 AM  PHQ 2/9 Scores  PHQ - 2 Score 1 1 0 1 0 0 0  PHQ- 9 Score 3 3    0 2    Fall Risk    07/11/2022    8:22 AM 06/29/2022    8:54 AM 03/31/2022    8:40 AM 06/29/2021    8:45 AM 04/29/2021    3:05 PM  Fall Risk   Falls in the past year? 0 0 1 0 0  Number falls in past yr: 0 0 0 0   Injury with Fall? 0 0 0    Risk for fall due to : No Fall Risks No Fall Risks History of fall(s)  No Fall Risks  Follow up Falls prevention discussed;Falls evaluation completed Falls evaluation completed Falls evaluation completed Falls evaluation completed Falls evaluation completed    FALL RISK PREVENTION PERTAINING TO THE HOME:  Any stairs in or around the home? Yes  If so, are there any without handrails? No  Home free of loose throw rugs in walkways, pet beds, electrical cords, etc? No  Adequate lighting in your home to reduce risk of falls? Yes   ASSISTIVE DEVICES UTILIZED TO PREVENT FALLS:  Life alert? No  Use of a cane, walker or w/c? No  Grab bars in the bathroom? Yes  Shower chair or bench in shower? Yes  Elevated toilet seat or a handicapped toilet? No   TIMED UP AND GO:  Was the test performed?  No Televisit .   Cognitive Function:    01/11/2017   11:36 AM 01/04/2016    8:24 AM  MMSE - Mini Mental State Exam  Orientation to time 5 5  Orientation to Place 5 5  Registration 3 3  Attention/ Calculation 5 5  Recall 3 3  Language- name 2 objects 2 2  Language- repeat 1 1  Language- follow 3 step command 3 3  Language- read & follow direction 1 1  Write a sentence 1 1  Copy design 1 1  Total score 30 30        07/11/2022    8:19  AM 01/14/2018   10:26 AM  6CIT Screen  What Year? 0 points 0 points  What month? 0 points 0 points  What time? 0 points 0 points  Count back from 20 0 points 0 points  Months in reverse 0 points 0 points  Repeat phrase 0 points 0 points  Total Score 0 points 0 points    Immunizations Immunization History  Administered Date(s) Administered   Fluad Quad(high Dose 65+) 12/09/2020, 12/09/2021   Influenza,inj,Quad PF,6+ Mos 11/27/2012   Influenza-Unspecified 12/01/2013, 12/17/2014, 12/23/2015, 12/27/2017, 12/17/2018, 12/26/2019   Moderna Covid-19 Vaccine Bivalent Booster 41yrs & up 01/05/2021   Moderna Sars-Covid-2 Vaccination 03/11/2019, 04/08/2019, 12/29/2019   Pneumococcal Conjugate-13 12/22/2013   Pneumococcal Polysaccharide-23 11/27/2009, 12/28/2014   Tdap 07/19/2013   Zoster Recombinat (Shingrix) 11/03/2020, 01/27/2021   Zoster, Live 11/28/2010    TDAP status: Up to date  Flu Vaccine status: Up to date  Pneumococcal vaccine status: Up to date  Covid-19 vaccine status: Information provided on how to obtain vaccines.   Qualifies for Shingles Vaccine? Yes   Zostavax completed Yes   Shingrix Completed?: Yes  Screening Tests Health Maintenance  Topic Date Due   COVID-19 Vaccine (5 - 2023-24 season) 10/28/2021   INFLUENZA VACCINE  09/28/2022   Medicare Annual Wellness (AWV)  07/11/2023   DTaP/Tdap/Td (2 - Td or Tdap) 07/20/2023   Pneumonia Vaccine 39+ Years old  Completed   DEXA SCAN  Completed   Zoster Vaccines- Shingrix  Completed   HPV VACCINES  Aged Out   MAMMOGRAM  Discontinued    Health Maintenance  Health Maintenance Due  Topic Date Due   COVID-19 Vaccine (5 - 2023-24 season) 10/28/2021    Colorectal cancer screening: No longer required.   Mammogram status: Completed 06/02/21. Repeat every year  Bone Density status: Completed 07/14/14. Results reflect: Bone density results: NORMAL. Repeat every 10 years.  Lung Cancer Screening: (Low Dose CT Chest  recommended if Age 32-80 years, 30 pack-year currently smoking OR have quit w/in 15years.) does not qualify.     Additional Screening:  Hepatitis C Screening: does not qualify; Completed 12/29/14  Vision Screening: Recommended annual ophthalmology exams for early detection of glaucoma and other disorders of the eye. Is the patient up to date with their annual eye exam?  Yes  Who is the provider or what is the name of the office in which the patient attends annual eye exams? [facility] If pt is not established with a provider, would they like to be referred to a provider to establish care? No .   Dental Screening: Recommended annual dental exams for proper oral hygiene  Community Resource Referral / Chronic Care Management: CRR required this visit?  No   CCM required this visit?  No      Plan:     I have personally reviewed and noted the following in the patient's chart:   Medical and social history Use of alcohol, tobacco or illicit drugs  Current medications and  supplements including opioid prescriptions. Patient is not currently taking opioid prescriptions. Functional ability and status Nutritional status Physical activity Advanced directives List of other physicians Hospitalizations, surgeries, and ER visits in previous 12 months Vitals Screenings to include cognitive, depression, and falls Referrals and appointments  In addition, I have reviewed and discussed with patient certain preventive protocols, quality metrics, and best practice recommendations. A written personalized care plan for preventive services as well as general preventive health recommendations were provided to patient.     Annabell Sabal, CMA   07/11/2022   Nurse Notes: none     I have reviewed the above information and agree with above.   Duncan Dull, MD

## 2022-07-18 ENCOUNTER — Ambulatory Visit: Payer: Self-pay | Admitting: *Deleted

## 2022-07-18 NOTE — Patient Outreach (Signed)
  Care Coordination   Follow Up Visit Note   07/18/2022 Name: Brianna Perry MRN: [id number] DOB: 23-Apr-1932  Brianna Perry is a 89 y.o. year old female who sees Darrick Huntsman, Mar Daring, MD for primary care. I spoke with  Roetta Sessions by phone today.  What matters to the patients health and wellness today?  Respite Care    Goals Addressed             This Visit's Progress    Patient Stated       Care Coordination Interventions: CSW referred patient for respite care program the [facility] Patient confirmed call received and will call them back to discuss respite options this week          SDOH assessments and interventions completed:  No     Care Coordination Interventions:  Yes, provided  Interventions Today    Flowsheet Row Most Recent Value  Chronic Disease   Chronic disease during today's visit Other  [caregivers stress]  General Interventions   General Interventions Discussed/Reviewed General Interventions Reviewed, [facility], Level of Care  Level of Care --  [patient referred for respite care through [facility]-they have reached out to spouse, she will need to call back this week to discuss options]      Follow up call scheduled for 08/01/22 Follow up plan:    Encounter Outcome:  Pt. Visit Completed

## 2022-07-18 NOTE — Patient Instructions (Signed)
Visit Information  Thank you for taking time to visit with me today. Please don't hesitate to contact me if I can be of assistance to you.   Following are the goals we discussed today:   Goals Addressed             This Visit's Progress    Patient Stated       Care Coordination Interventions: CSW referred patient for respite care program the [facility] Patient confirmed call received and will call them back to discuss respite options this week          Our next appointment is by telephone on 08/01/22 at 11am  Please call the care guide team at [phone] if you need to cancel or reschedule your appointment.   If you are experiencing a Mental Health or Behavioral Health Crisis or need someone to talk to, please call 911   Patient verbalizes understanding of instructions and care plan provided today and agrees to view in MyChart. Active MyChart status and patient understanding of how to access instructions and care plan via MyChart confirmed with patient.     Telephone follow up appointment with care management team member scheduled for: 08/01/22  Verna Czech, LCSW Clinical Social Worker  [facility] Care Management [phone]

## 2022-08-01 ENCOUNTER — Encounter: Payer: PPO | Admitting: *Deleted

## 2022-08-03 ENCOUNTER — Ambulatory Visit: Payer: Self-pay | Admitting: *Deleted

## 2022-08-03 NOTE — Patient Instructions (Addendum)
Visit Information  Thank you for taking time to visit with me today. Please don't hesitate to contact me if I can be of assistance to you.   Following are the goals we discussed today:   Goals Addressed             This Visit's Progress    Respite care resources       Care Coordination Interventions: CSW referred patient for respite care program the [facility] contacted, confirmed that they have patient's referral and will begin providing the service next fiscal year-July 2024 Plan is to call patient at the           Our next appointment is by telephone on 08/17/22 at   Please call the care guide team at [phone] if you need to cancel or reschedule your appointment.   If you are experiencing a Mental Health or Behavioral Health Crisis or need someone to talk to, please call 911   Patient verbalizes understanding of instructions and care plan provided today and agrees to view in MyChart. Active MyChart status and patient understanding of how to access instructions and care plan via MyChart confirmed with patient.     Telephone follow up appointment with care management team member scheduled for: 08/17/22   Verna Czech, LCSW Clinical Social Worker  [facility] Care Management [phone]

## 2022-08-03 NOTE — Patient Outreach (Addendum)
  Care Coordination   Follow Up Visit Note   08/04/2022 Name: Brianna Perry MRN: [id number] DOB: 11-10-1932  Brianna Perry is a 89 y.o. year old female who sees Darrick Huntsman, Mar Daring, MD for primary care. I spoke with  Brianna Perry by phone today.  What matters to the patients health and wellness today?  Respite Care    Goals Addressed             This Visit's Progress    Respite care resources       Care Coordination Interventions: CSW referred patient for respite care program the [facility] contacted, confirmed that they have patient's referral and will begin providing the service next fiscal year-July 2024 Plan is to call patient at the           SDOH assessments and interventions completed:  No     Care Coordination Interventions:  Yes, provided  Interventions Today    Flowsheet Row Most Recent Value  Chronic Disease   Chronic disease during today's visit Other  [caregiver stress]  General Interventions   General Interventions Discussed/Reviewed General Interventions Reviewed, [employer] with --  [facility], confirmed that patient is on the waiting list, respite care hours to begin next fiscal year july 2024.  Patient should receive a call by the end of the month]  Level of Care --  [patient referred for repsite care services, they have contacted her but no additional contact to discuss status. CSW to contact [facility] to follow up on status of referral]       Follow up plan: Follow up call scheduled for 08/17/22    Encounter Outcome:  Pt. Visit Completed

## 2022-08-17 ENCOUNTER — Telehealth: Payer: Self-pay | Admitting: *Deleted

## 2022-08-17 NOTE — Patient Instructions (Signed)
Visit Information  Thank you for taking time to visit with me today. Please don't hesitate to contact me if I can be of assistance to you.   Following are the goals we discussed today:  CSW to follow up on referral for respite care through [facility] for spouse.   Our next appointment is by telephone on 08/18/22 at 11am  Please call the care guide team at [phone] if you need to cancel or reschedule your appointment.   If you are experiencing a Mental Health or Behavioral Health Crisis or need someone to talk to, please call 911   Patient verbalizes understanding of instructions and care plan provided today and agrees to view in MyChart. Active MyChart status and patient understanding of how to access instructions and care plan via MyChart confirmed with patient.     Telephone follow up appointment with care management team member scheduled for: 08/18/22  Verna Czech, LCSW Clinical Social Worker  [facility] Care Management [phone]

## 2022-08-17 NOTE — Patient Outreach (Signed)
  Care Coordination   Follow Up Visit Note   08/17/2022 Name: ADAIJA MAYBURY MRN: [id number] DOB: [birth year]  ADILYNNE GAUMER is a 89 y.o. year old female who sees Darrick Huntsman, Mar Daring, MD for primary care. I spoke with  Roetta Sessions by phone today.  What matters to the patients health and wellness today?  Respite care for spouse   Goals Addressed             This Visit's Progress    Respite care resources       Interventions Today    Flowsheet Row Most Recent Value  Chronic Disease   Chronic disease during today's visit Other  [caregiver stress]  General Interventions   General Interventions Discussed/Reviewed General Interventions Reviewed, [employer]  [patient referred for respite care through [facility]. Patient confirmed that she has not received a follow up call. CSW to follow up on status of referral]              SDOH assessments and interventions completed:  No     Care Coordination Interventions:  Yes, provided   Follow up plan: Follow up call scheduled for 08/17/22    Encounter Outcome:  Pt. Visit Completed

## 2022-08-18 ENCOUNTER — Ambulatory Visit: Payer: Self-pay | Admitting: *Deleted

## 2022-08-18 NOTE — Patient Instructions (Signed)
Visit Information  Thank you for taking time to visit with me today. Please don't hesitate to contact me if I can be of assistance to you.   Following are the goals we discussed today:  Patient to follow up with [facility] regarding their Respite Care program.   Our next appointment is by telephone on 09/01/22 at 10am  Please call the care guide team at [phone] if you need to cancel or reschedule your appointment.   If you are experiencing a Mental Health or Behavioral Health Crisis or need someone to talk to, please call 911   Patient verbalizes understanding of instructions and care plan provided today and agrees to view in MyChart. Active MyChart status and patient understanding of how to access instructions and care plan via MyChart confirmed with patient.     Telephone follow up appointment with care management team member scheduled for: 09/01/22  Verna Czech, LCSW Clinical Social Worker  [facility] Care Management [phone]

## 2022-08-18 NOTE — Patient Outreach (Addendum)
  Care Coordination   Follow Up Visit Note   08/18/2022 Name: Brianna Perry MRN: [id number] DOB: [birth year]  [name] is a 89 y.o. year old female who sees Darrick Huntsman, Mar Daring, MD for primary care. I spoke with  Roetta Sessions by phone today.  What matters to the patients health and wellness today?  Respite Care for spouse. Patient interested in coordinating respite care through [facility]. [facility] contacted, confirmed that patient is on list to receive services, however services will not be available until July 2024. [facility] will contact patient to discuss respite care options.   Goals Addressed             This Visit's Progress    Respite care resources       Interventions Today    Flowsheet Row Most Recent Value  Chronic Disease   Chronic disease during today's visit Other  [cargiver spouse]  General Interventions   General Interventions Discussed/Reviewed General Interventions Reviewed, Community Resources  [Follow up phone call to [facility] regarding Respite Care referral. Referral received, respite should begin next fiscal year (July) [facility] will contact patient]              SDOH assessments and interventions completed:  No     Care Coordination Interventions:  Yes, provided   Follow up plan: Follow up call scheduled for 09/01/22    Encounter Outcome:  Pt. Visit Completed

## 2022-09-01 ENCOUNTER — Ambulatory Visit: Payer: Self-pay | Admitting: *Deleted

## 2022-09-01 NOTE — Patient Outreach (Addendum)
  Care Coordination   Follow Up Visit Note   09/01/2022 Name: Brianna Perry MRN: [id number] DOB: 1932/03/15  Brianna Perry is a 89 y.o. year old female who sees Darrick Huntsman, Mar Daring, MD for primary care. I spoke with  Brianna Perry by phone today.  What matters to the patients health and wellness today?  Respite care through [facility].    Goals Addressed             This Visit's Progress    Respite care resources       Interventions Today    Flowsheet Row Most Recent Value  Chronic Disease   Chronic disease during today's visit Other  [caregiver stress]  General Interventions   General Interventions Discussed/Reviewed General Interventions Reviewed, [employer] confirms that she has been contacted by [facility] regarding the respite care, they will contact patient once funding comes in-new fiscal year starts July 2024-confirmed that spouse's mobility improving, no falls]              SDOH assessments and interventions completed:  No     Care Coordination Interventions:  Yes, provided   Follow up plan: Follow up call scheduled for 09/29/22    Encounter Outcome:  Pt. Visit Completed

## 2022-09-01 NOTE — Patient Instructions (Signed)
Visit Information  Thank you for taking time to visit with me today. Please don't hesitate to contact me if I can be of assistance to you.   Following are the goals we discussed today:  Please follow up with [facility] for update on respite care program   Our next appointment is by telephone on 09/29/22 at 10am  Please call the care guide team at [phone] if you need to cancel or reschedule your appointment.   If you are experiencing a Mental Health or Behavioral Health Crisis or need someone to talk to, please call 911   Patient verbalizes understanding of instructions and care plan provided today and agrees to view in MyChart. Active MyChart status and patient understanding of how to access instructions and care plan via MyChart confirmed with patient.     Telephone follow up appointment with care management team member scheduled for: 09/29/22  Verna Czech, LCSW Clinical Social Worker  [facility] Care Management [phone]

## 2022-09-20 ENCOUNTER — Other Ambulatory Visit: Payer: Self-pay | Admitting: Internal Medicine

## 2022-09-26 ENCOUNTER — Encounter: Payer: Self-pay | Admitting: Internal Medicine

## 2022-09-29 ENCOUNTER — Ambulatory Visit: Payer: Self-pay | Admitting: *Deleted

## 2022-09-29 NOTE — Patient Instructions (Signed)
Visit Information  Thank you for taking time to visit with me today. Please don't hesitate to contact me if I can be of assistance to you.   Following are the goals we discussed today:  Please call the patient caregiver support program coordinator to discuss options for respite care and any other additional services [phone]   Our next appointment is by telephone on 10/20/22 at 11am  Please call the care guide team at [phone] if you need to cancel or reschedule your appointment.   If you are experiencing a Mental Health or Behavioral Health Crisis or need someone to talk to, please call 911   Patient verbalizes understanding of instructions and care plan provided today and agrees to view in MyChart. Active MyChart status and patient understanding of how to access instructions and care plan via MyChart confirmed with patient.     Telephone follow up appointment with care management team member scheduled for: 10/20/22  Verna Czech, LCSW Clinical Social Worker  [facility] Care Management [phone]

## 2022-09-29 NOTE — Patient Outreach (Signed)
  Care Coordination   Follow Up Visit Note   09/29/2022 Name: Brianna Perry MRN: [id number] DOB: 08-21-1932  Brianna Perry is a 89 y.o. year old female who sees Darrick Huntsman, Mar Daring, MD for primary care. I spoke with  Brianna Perry by phone today.  What matters to the patients health and wellness today?  Caregiver support and respite services    Goals Addressed             This Visit's Progress    Respite care resources       Interventions Today    Flowsheet Row Most Recent Value  Chronic Disease   Chronic disease during today's visit Other  [caregiver fatigue]  General Interventions   General Interventions Discussed/Reviewed General Interventions Reviewed, [employer], Communication with  [confirmed that patient is #7 on the waiting list for the caregiver support program]  Communication with --  [facility] care giver support program coordinator discussed alterative options for caregiver support through [facility]-it was suggested that pt to call [facility] directly to discuss options further-contact provided to patient who is agreeable to call]              SDOH assessments and interventions completed:  No     Care Coordination Interventions:  Yes, provided   Follow up plan: Follow up call scheduled for 10/20/22    Encounter Outcome:  Pt. Visit Completed

## 2022-10-20 ENCOUNTER — Ambulatory Visit: Payer: Self-pay | Admitting: *Deleted

## 2022-10-21 NOTE — Patient Outreach (Signed)
  Care Coordination   Follow Up Visit Note   10/21/2022 Late Entry Name: CHLOEY SCHIESSL MRN: [id number] DOB: 14-Aug-1932  BLANCHIE CROWE is a 89 y.o. year old female who sees Darrick Huntsman, Mar Daring, MD for primary care. I spoke with  Roetta Sessions by phone on 10/20/22.  What matters to the patients health and wellness today?  Resources for respite care and in home support.    Goals Addressed             This Visit's Progress    Respite care resources       Interventions Today    Flowsheet Row Most Recent Value  Chronic Disease   Chronic disease during today's visit Other  [caregiver fatigue]  General Interventions   General Interventions Discussed/Reviewed General Interventions Discussed, General Interventions Reviewed, [employer], Level of Care  [discussed referral to papa pals companionship program-declined this referral confirms having someone already in place to assist with household chores-confirmed that spouse is on the wait list for the caregiver support program through [facility] providers]  Level of Care Personal Care Services  [referral previously placed for the caregiver support program through [facility]-patient's spouse on waiting list and is currently #7]              SDOH assessments and interventions completed:  No     Care Coordination Interventions:  Yes, provided   Follow up plan: No further intervention required.   Encounter Outcome:  Pt. Visit Completed

## 2022-10-21 NOTE — Patient Instructions (Signed)
Visit Information  Thank you for taking time to visit with me today. Please don't hesitate to contact me if I can be of assistance to you.   Following are the goals we discussed today:  Patient's spouse remains on waiting list for the in home caregiver support program through [facility], please follow up with them periodically to check status of referral [phone]  If you are experiencing a Mental Health or Behavioral Health Crisis or need someone to talk to, please call 911   Patient verbalizes understanding of instructions and care plan provided today and agrees to view in MyChart. Active MyChart status and patient understanding of how to access instructions and care plan via MyChart confirmed with patient.     No further follow up required: patient's spouse to contact this Child psychotherapist with any additional community resource needs   [place], [place] Clinical Social Worker  [phone]

## 2022-11-24 ENCOUNTER — Encounter: Payer: Self-pay | Admitting: Internal Medicine

## 2022-12-01 ENCOUNTER — Other Ambulatory Visit: Payer: Self-pay | Admitting: Internal Medicine

## 2022-12-05 ENCOUNTER — Encounter: Payer: Self-pay | Admitting: Internal Medicine

## 2023-01-10 DIAGNOSIS — G8929 Other chronic pain: Secondary | ICD-10-CM | POA: Diagnosis not present

## 2023-01-10 DIAGNOSIS — K219 Gastro-esophageal reflux disease without esophagitis: Secondary | ICD-10-CM | POA: Diagnosis not present

## 2023-01-10 DIAGNOSIS — E559 Vitamin D deficiency, unspecified: Secondary | ICD-10-CM | POA: Diagnosis not present

## 2023-01-10 DIAGNOSIS — R32 Unspecified urinary incontinence: Secondary | ICD-10-CM | POA: Diagnosis not present

## 2023-01-10 DIAGNOSIS — E669 Obesity, unspecified: Secondary | ICD-10-CM | POA: Diagnosis not present

## 2023-01-10 DIAGNOSIS — Z7982 Long term (current) use of aspirin: Secondary | ICD-10-CM | POA: Diagnosis not present

## 2023-01-10 DIAGNOSIS — E538 Deficiency of other specified B group vitamins: Secondary | ICD-10-CM | POA: Diagnosis not present

## 2023-03-20 ENCOUNTER — Other Ambulatory Visit: Payer: Self-pay | Admitting: Internal Medicine

## 2023-03-25 ENCOUNTER — Encounter: Payer: Self-pay | Admitting: Internal Medicine

## 2023-03-26 NOTE — Telephone Encounter (Signed)
Spoke with Brianna Perry and scheduled her husband for a visit this afternoon.

## 2023-03-26 NOTE — Telephone Encounter (Signed)
Spoke with Ivelis to let her know what the Spironolactone was prescribed to her daughter for.

## 2023-04-05 ENCOUNTER — Ambulatory Visit (INDEPENDENT_AMBULATORY_CARE_PROVIDER_SITE_OTHER): Payer: PPO | Admitting: Internal Medicine

## 2023-04-05 ENCOUNTER — Encounter: Payer: Self-pay | Admitting: Internal Medicine

## 2023-04-05 ENCOUNTER — Ambulatory Visit: Payer: PPO

## 2023-04-05 VITALS — BP 128/64 | HR 76 | Ht 63.0 in | Wt 182.4 lb

## 2023-04-05 DIAGNOSIS — M25562 Pain in left knee: Secondary | ICD-10-CM

## 2023-04-05 DIAGNOSIS — R7303 Prediabetes: Secondary | ICD-10-CM

## 2023-04-05 DIAGNOSIS — F5104 Psychophysiologic insomnia: Secondary | ICD-10-CM | POA: Diagnosis not present

## 2023-04-05 DIAGNOSIS — G8929 Other chronic pain: Secondary | ICD-10-CM | POA: Diagnosis not present

## 2023-04-05 DIAGNOSIS — M79645 Pain in left finger(s): Secondary | ICD-10-CM | POA: Diagnosis not present

## 2023-04-05 DIAGNOSIS — Z0000 Encounter for general adult medical examination without abnormal findings: Secondary | ICD-10-CM | POA: Diagnosis not present

## 2023-04-05 DIAGNOSIS — Z0001 Encounter for general adult medical examination with abnormal findings: Secondary | ICD-10-CM

## 2023-04-05 DIAGNOSIS — Z79899 Other long term (current) drug therapy: Secondary | ICD-10-CM

## 2023-04-05 DIAGNOSIS — M1712 Unilateral primary osteoarthritis, left knee: Secondary | ICD-10-CM | POA: Diagnosis not present

## 2023-04-05 DIAGNOSIS — E7800 Pure hypercholesterolemia, unspecified: Secondary | ICD-10-CM

## 2023-04-05 DIAGNOSIS — E66811 Obesity, class 1: Secondary | ICD-10-CM | POA: Diagnosis not present

## 2023-04-05 DIAGNOSIS — M1812 Unilateral primary osteoarthritis of first carpometacarpal joint, left hand: Secondary | ICD-10-CM | POA: Diagnosis not present

## 2023-04-05 LAB — LIPID PANEL
Cholesterol: 218 mg/dL — ABNORMAL HIGH (ref 0–200)
HDL: 86.3 mg/dL (ref >39.00)
LDL Cholesterol: 103 mg/dL — ABNORMAL HIGH (ref 0–99)
NonHDL: 131.76
Total CHOL/HDL Ratio: 3
Triglycerides: 145 mg/dL (ref 0.0–149.0)
VLDL: 29 mg/dL (ref 0.0–40.0)

## 2023-04-05 LAB — CBC WITH DIFFERENTIAL/PLATELET
Basophils Absolute: 0.1 10*3/uL (ref 0.0–0.1)
Basophils Relative: 1.1 % (ref 0.0–3.0)
Eosinophils Absolute: 0.1 10*3/uL (ref 0.0–0.7)
Eosinophils Relative: 1.8 % (ref 0.0–5.0)
HCT: 39.9 % (ref 36.0–46.0)
Hemoglobin: 13.1 g/dL (ref 12.0–15.0)
Lymphocytes Relative: 22.9 % (ref 12.0–46.0)
Lymphs Abs: 1.4 10*3/uL (ref 0.7–4.0)
MCHC: 32.8 g/dL (ref 30.0–36.0)
MCV: 88.6 fL (ref 78.0–100.0)
Monocytes Absolute: 0.5 10*3/uL (ref 0.1–1.0)
Monocytes Relative: 8.6 % (ref 3.0–12.0)
Neutro Abs: 4 10*3/uL (ref 1.4–7.7)
Neutrophils Relative %: 65.6 % (ref 43.0–77.0)
Platelets: 300 10*3/uL (ref 150.0–400.0)
RBC: 4.5 Mil/uL (ref 3.87–5.11)
RDW: 14.7 % (ref 11.5–15.5)
WBC: 6.1 10*3/uL (ref 4.0–10.5)

## 2023-04-05 LAB — COMPREHENSIVE METABOLIC PANEL
ALT: 16 U/L (ref 0–35)
AST: 16 U/L (ref 0–37)
Albumin: 4.1 g/dL (ref 3.5–5.2)
Alkaline Phosphatase: 66 U/L (ref 39–117)
BUN: 11 mg/dL (ref 6–23)
CO2: 28 meq/L (ref 19–32)
Calcium: 9.5 mg/dL (ref 8.4–10.5)
Chloride: 102 meq/L (ref 96–112)
Creatinine, Ser: 0.59 mg/dL (ref 0.40–1.20)
GFR: 80.29 mL/min (ref >60.00)
Glucose, Bld: 97 mg/dL (ref 70–99)
Potassium: 4.2 meq/L (ref 3.5–5.1)
Sodium: 139 meq/L (ref 135–145)
Total Bilirubin: 0.4 mg/dL (ref 0.2–1.2)
Total Protein: 7.3 g/dL (ref 6.0–8.3)

## 2023-04-05 LAB — HEMOGLOBIN A1C: Hgb A1c MFr Bld: 6.1 % (ref 4.6–6.5)

## 2023-04-05 LAB — LDL CHOLESTEROL, DIRECT: Direct LDL: 118 mg/dL

## 2023-04-05 LAB — TSH: TSH: 2.71 u[IU]/mL (ref 0.35–5.50)

## 2023-04-05 NOTE — Patient Instructions (Addendum)
  Ok to use voltaren gel on your thumb joint  Use a horseshoe pillow on your neck when you are reading  Take 1000 mg tylenol one hour before bedtime  I will add meloxicam  if your kidney function is normal

## 2023-04-05 NOTE — Progress Notes (Signed)
 Patient ID: Brianna Perry, female    DOB: 20-Jan-1933  Age: 89 y.o. MRN: [id number]  The patient is here for annual preventive wellness examination and management of other chronic and acute problems.   The risk factors are reflected in the social history.  The roster of all physicians providing medical care to patient - is listed in the Snapshot section of the chart.  Activities of daily living:  The patient is 100% independent in all ADLs: dressing, toileting, feeding as well as independent mobility  Home safety : The patient has smoke detectors in the home. They wear seatbelts.  There are no firearms at home. There is no violence in the home.   There is no risks for hepatitis, STDs or HIV. There is no   history of blood transfusion. They have no travel history to infectious disease endemic areas of the world.  The patient has seen their dentist in the last six month. They have seen their eye doctor in the last year. They admit to slight hearing difficulty with regard to whispered voices and some television programs.  They have deferred audiologic testing in the last year.  They do not  have excessive sun exposure. Discussed the need for sun protection: hats, long sleeves and use of sunscreen if there is significant sun exposure.   Diet: the importance of a healthy diet is discussed. They do have a healthy diet.  The benefits of regular aerobic exercise were discussed. She walks 2 times per week ,  20 minutes.   Depression screen: there are no signs or vegative symptoms of depression- irritability, change in appetite, anhedonia, sadness/tearfullness.  Cognitive assessment: the patient manages all their financial and personal affairs and is actively engaged. They could relate day,date,year and events; recalled 2/3 objects at 3 minutes; performed clock-face test normally.  The following portions of the patient's history were reviewed and updated as appropriate: allergies, current medications,  past family history, past medical history,  past surgical history, past social history  and problem list.  Visual acuity was not assessed per patient preference since she has regular follow up with her ophthalmologist. Hearing and body mass index were assessed and reviewed.   During the course of the visit the patient was educated and counseled about appropriate screening and preventive services including : fall prevention , diabetes screening, nutrition counseling, colorectal cancer screening, and recommended immunizations.    CC: The primary encounter diagnosis was Obesity (BMI 30.0-34.9). Diagnoses of Prediabetes, Long-term use of high-risk medication, Pure hypercholesterolemia, Thumb pain, left, Medial knee pain, left, Left medial knee pain, Encounter for general adult medical examination with abnormal findings, and Chronic insomnia were also pertinent to this visit.  1) neck pain worse at night ,  has tried different pillows.  Spends hours reading in a reclliner before bed.  No radiating pain or numbness or weakness  2)  left thumb  pain with liftng a skillet that is heavy   exam normal   3) left knee pain :  using voltaren .  Has been hurting Hurting for hte past 6 months.  Some days requires crutches to walk.  Hurts with all weight bearing . Gets worse with proloned standing ,  no prior ortho referral   History Cher has a past medical history of Arthritis, Breast cancer (HCC) (2010), Breast mass (x couple months), Cancer (HCC) (2010), Chickenpox, GERD (gastroesophageal reflux disease), Personal history of radiation therapy, and Urine incontinence.   She has a past surgical history that  includes Abdominal hysterectomy (1968); Tonsillectomy (1941); Colonoscopy; Eye surgery; Tonsillectomy; Colonoscopy with propofol  (N/A, 03/23/2015); Breast surgery (Right, 2010); Breast lumpectomy (Right, 2010); Augmentation mammaplasty (Bilateral, 1973); Breast cyst aspiration (Left, 2015); and Breast cyst  aspiration (Left, 02/2016).   Her family history includes Arthritis in her mother; Breast cancer in her paternal aunt and paternal grandmother; Heart disease in her father and mother; Hypertension in her mother.She reports that she has never smoked. She has never used smokeless tobacco. She reports that she does not drink alcohol and does not use drugs.  Outpatient Medications Prior to Visit  Medication Sig Dispense Refill   aspirin 81 MG tablet Take 81 mg by mouth daily.     calcium  carbonate (OS-CAL) 600 MG TABS tablet Take 1,200 mg by mouth daily.     fluticasone  (FLONASE ) 50 MCG/ACT nasal spray Place 2 sprays into both nostrils daily. 16 g 6   Multiple Vitamin (MULTIVITAMIN) capsule Take 1 capsule by mouth daily.     Multiple Vitamins-Minerals (RA VISION-VITE PRESERVE PO) Take 1 tablet by mouth daily.     Omega-3 Fatty Acids (FISH OIL) 300 MG CAPS Take 1 capsule by mouth daily.     omeprazole  (PRILOSEC) 20 MG capsule TAKE 1 CAPSULE(20 MG) BY MOUTH EVERY MORNING 90 capsule 1   Probiotic Product (PROBIOTIC-10 PO) Take 1 capsule by mouth daily.     TURMERIC PO Take 1 capsule by mouth daily.     zolpidem  (AMBIEN ) 5 MG tablet TAKE 1 TABLET(5 MG) BY MOUTH AT BEDTIME AS NEEDED FOR SLEEP 15 tablet 5   sertraline  (ZOLOFT ) 25 MG tablet Take 1 tablet (25 mg total) by mouth daily. (Patient not taking: Reported on 04/05/2023) 90 tablet 3   No facility-administered medications prior to visit.    Review of Systems Patient denies headache, fevers, malaise, unintentional weight loss, skin rash, eye pain, sinus congestion and sinus pain, sore throat, dysphagia,  hemoptysis , cough, dyspnea, wheezing, chest pain, palpitations, orthopnea, edema, abdominal pain, nausea, melena, diarrhea, constipation, flank pain, dysuria, hematuria, urinary  Frequency, nocturia, numbness, tingling, seizures,  Focal weakness, Loss of consciousness,  Tremor, insomnia, depression, anxiety, and suicidal ideation.     Objective:   BP 128/64   Pulse 76   Ht 5' 3 (1.6 m)   Wt 182 lb 6.4 oz (82.7 kg)   SpO2 96%   BMI 32.31 kg/m   Physical Exam Vitals reviewed.  Constitutional:      General: She is not in acute distress.    Appearance: Normal appearance. She is normal weight. She is not ill-appearing, toxic-appearing or diaphoretic.  HENT:     Head: Normocephalic.  Eyes:     General: No scleral icterus.       Right eye: No discharge.        Left eye: No discharge.     Conjunctiva/sclera: Conjunctivae normal.  Cardiovascular:     Rate and Rhythm: Normal rate and regular rhythm.     Heart sounds: Normal heart sounds.  Pulmonary:     Effort: Pulmonary effort is normal. No respiratory distress.     Breath sounds: Normal breath sounds.  Musculoskeletal:     Right hand: Normal.     Left hand: Tenderness present. Decreased range of motion.       Arms:  Skin:    General: Skin is warm and dry.  Neurological:     General: No focal deficit present.     Mental Status: She is alert and oriented to person, place,  and time. Mental status is at baseline.  Psychiatric:        Mood and Affect: Mood normal.        Behavior: Behavior normal.        Thought Content: Thought content normal.        Judgment: Judgment normal.      Assessment & Plan:  Obesity (BMI 30.0-34.9)  Prediabetes -     Comprehensive metabolic panel -     Hemoglobin A1c  Long-term use of high-risk medication -     TSH -     CBC with Differential/Platelet  Pure hypercholesterolemia -     Lipid panel -     LDL cholesterol, direct  Thumb pain, left Assessment & Plan: Loss of joint space noted on plain films.  Advised to use voltaren gel and tylenol   Orders: -     DG Finger Thumb Left; Future  Medial knee pain, left -     DG Knee Complete 4 Views Left; Future  Left medial knee pain Assessment & Plan: Unclear how much is secondary to DJD knee vs referred pain from lower spine.   Plain films note loss of joint space  medially .  Advised to use voltaren gel /tylenol    Encounter for general adult medical examination with abnormal findings Assessment & Plan: age appropriate education and counseling updated, referrals for preventative services and immunizations addressed, dietary and smoking counseling addressed, most recent labs reviewed.  I have personally reviewed and have noted:   1) the patient's medical and social history 2) The pt's use of alcohol, tobacco, and illicit drugs 3) The patient's current medications and supplements 4) Functional ability including ADL's, fall risk, home safety risk, hearing and visual impairment 5) Diet and physical activities 6) Evidence for depression or mood disorder 7) The patient's height, weight, and BMI have been recorded in the chart  I have made referrals, and provided counseling and education based on review of the above    Chronic insomnia Assessment & Plan: She hcontiues to require prn use of ambien  due to anxiety . I have reviewed the dangers of prescribing this drug to the elderly with patient.   She is aware of the risks       Follow-up: No follow-ups on file.   Verneita LITTIE Kettering, MD

## 2023-04-07 DIAGNOSIS — M79645 Pain in left finger(s): Secondary | ICD-10-CM | POA: Insufficient documentation

## 2023-04-07 NOTE — Assessment & Plan Note (Signed)
 She hcontiues to require prn use of ambien  due to anxiety . I have reviewed the dangers of prescribing this drug to the elderly with patient.   She is aware of the risks

## 2023-04-07 NOTE — Assessment & Plan Note (Addendum)
 Unclear how much is secondary to DJD knee vs referred pain from lower spine.   Plain films note loss of joint space  medially . Advised to use voltaren gel /tylenol ,  adding once daily NSADI given normal GFR

## 2023-04-07 NOTE — Assessment & Plan Note (Signed)
 Loss of joint space noted on plain films.  Advised to use voltaren gel and tylenol

## 2023-04-07 NOTE — Assessment & Plan Note (Signed)

## 2023-04-08 ENCOUNTER — Encounter: Payer: Self-pay | Admitting: Internal Medicine

## 2023-04-08 MED ORDER — CELECOXIB 200 MG PO CAPS: 200.0000 mg | ORAL_CAPSULE | Freq: Every day | ORAL | 2 refills | Status: DC

## 2023-04-08 NOTE — Addendum Note (Signed)
 Addended by: Thersia Flax on: 04/08/2023 03:02 PM   Modules accepted: Orders

## 2023-04-10 ENCOUNTER — Encounter: Payer: Self-pay | Admitting: Internal Medicine

## 2023-04-18 ENCOUNTER — Encounter: Payer: Self-pay | Admitting: Internal Medicine

## 2023-04-18 NOTE — Telephone Encounter (Signed)
See message in pt's daughter's chart.

## 2023-04-19 ENCOUNTER — Encounter: Payer: Self-pay | Admitting: Internal Medicine

## 2023-04-20 ENCOUNTER — Encounter: Payer: Self-pay | Admitting: Internal Medicine

## 2023-04-20 NOTE — Telephone Encounter (Signed)
 Noted

## 2023-05-22 ENCOUNTER — Encounter: Payer: Self-pay | Admitting: Internal Medicine

## 2023-05-28 ENCOUNTER — Telehealth: Payer: Self-pay

## 2023-05-28 NOTE — Telephone Encounter (Signed)
 See message in pt's chart

## 2023-05-28 NOTE — Telephone Encounter (Signed)
 Copied from CRM [id number]. Topic: General - Other >> May 28, 2023  1:46 PM Abigail D wrote: Reason for CRM: Shanda Bumps called this morning concerning husband Fayrene Fearing. If she could give a call back: [phone]

## 2023-05-28 NOTE — Telephone Encounter (Signed)
See telephone encounter in patient's chart.

## 2023-06-04 DIAGNOSIS — M3501 Sicca syndrome with keratoconjunctivitis: Secondary | ICD-10-CM | POA: Diagnosis not present

## 2023-06-04 DIAGNOSIS — H2640 Unspecified secondary cataract: Secondary | ICD-10-CM | POA: Diagnosis not present

## 2023-06-04 DIAGNOSIS — H43813 Vitreous degeneration, bilateral: Secondary | ICD-10-CM | POA: Diagnosis not present

## 2023-06-30 ENCOUNTER — Other Ambulatory Visit: Payer: Self-pay | Admitting: Internal Medicine

## 2023-07-02 NOTE — Telephone Encounter (Signed)
 Refilled: 12/04/2022 Last OV: 04/05/2023 Next OV: 04/09/2024

## 2023-07-09 ENCOUNTER — Other Ambulatory Visit: Payer: Self-pay | Admitting: Internal Medicine

## 2023-08-03 ENCOUNTER — Encounter: Payer: Self-pay | Admitting: Internal Medicine

## 2023-08-03 DIAGNOSIS — M25562 Pain in left knee: Secondary | ICD-10-CM

## 2023-08-03 NOTE — Telephone Encounter (Signed)
 I am having terrible pain in my left knee. Do you think a shot would help? If so I need a referral. Also I guess you read the report about Brianna Perry yesterday. When is his appointment next week? Brianna Perry   I will send her a my chart with the husband's appointment on 08/08/23 at 9:30.

## 2023-08-06 ENCOUNTER — Encounter: Payer: Self-pay | Admitting: Internal Medicine

## 2023-08-08 ENCOUNTER — Encounter: Payer: Self-pay | Admitting: Internal Medicine

## 2023-08-24 DIAGNOSIS — M1712 Unilateral primary osteoarthritis, left knee: Secondary | ICD-10-CM | POA: Diagnosis not present

## 2023-09-12 ENCOUNTER — Other Ambulatory Visit: Payer: Self-pay | Admitting: Internal Medicine

## 2023-10-03 ENCOUNTER — Other Ambulatory Visit: Payer: Self-pay | Admitting: Internal Medicine

## 2023-11-22 ENCOUNTER — Encounter: Payer: Self-pay | Admitting: Internal Medicine

## 2023-12-04 ENCOUNTER — Encounter: Payer: Self-pay | Admitting: Internal Medicine

## 2023-12-14 ENCOUNTER — Encounter: Payer: Self-pay | Admitting: Internal Medicine

## 2024-01-09 ENCOUNTER — Other Ambulatory Visit: Payer: Self-pay | Admitting: Internal Medicine

## 2024-03-26 ENCOUNTER — Encounter: Payer: Self-pay | Admitting: Internal Medicine

## 2024-04-01 ENCOUNTER — Other Ambulatory Visit: Payer: Self-pay | Admitting: Internal Medicine

## 2024-04-09 ENCOUNTER — Encounter: Payer: PPO | Admitting: Internal Medicine

## 2024-06-11 ENCOUNTER — Ambulatory Visit
